# Patient Record
Sex: Male | Born: 1960
Health system: Southern US, Community
[De-identification: ages and names within clinical notes are randomized; demographics above are authoritative.]

## PROBLEM LIST (undated history)

## (undated) DIAGNOSIS — R002 Palpitations: Secondary | ICD-10-CM

## (undated) DIAGNOSIS — R079 Chest pain, unspecified: Secondary | ICD-10-CM

## (undated) DIAGNOSIS — I10 Essential (primary) hypertension: Secondary | ICD-10-CM

## (undated) DIAGNOSIS — R0989 Other specified symptoms and signs involving the circulatory and respiratory systems: Secondary | ICD-10-CM

## (undated) DIAGNOSIS — E782 Mixed hyperlipidemia: Secondary | ICD-10-CM

## (undated) DIAGNOSIS — I739 Peripheral vascular disease, unspecified: Secondary | ICD-10-CM

## (undated) DIAGNOSIS — R011 Cardiac murmur, unspecified: Secondary | ICD-10-CM

## (undated) DIAGNOSIS — G4733 Obstructive sleep apnea (adult) (pediatric): Secondary | ICD-10-CM

## (undated) HISTORY — DX: Peripheral vascular disease, unspecified: I73.9

## (undated) HISTORY — PX: BACK SURGERY: SHX140

## (undated) HISTORY — DX: Essential (primary) hypertension: I10

## (undated) HISTORY — DX: Chest pain, unspecified: R07.9

## (undated) HISTORY — PX: COLONOSCOPY: SHX174

## (undated) HISTORY — DX: Cardiac murmur, unspecified: R01.1

## (undated) HISTORY — DX: Obstructive sleep apnea (adult) (pediatric): G47.33

## (undated) HISTORY — DX: Mixed hyperlipidemia: E78.2

## (undated) HISTORY — PX: HEEL SPUR EXCISION: SHX1733

## (undated) HISTORY — PX: UPPER GASTROINTESTINAL ENDOSCOPY: SHX188

## (undated) HISTORY — DX: Other specified symptoms and signs involving the circulatory and respiratory systems: R09.89

---

## 2000-03-21 HISTORY — PX: OTHER SURGICAL HISTORY: SHX169

## 2002-01-25 ENCOUNTER — Ambulatory Visit (HOSPITAL_COMMUNITY): Admission: RE | Admit: 2002-01-25 | Discharge: 2002-01-25 | Payer: Self-pay | Admitting: Family Medicine

## 2002-01-25 ENCOUNTER — Encounter: Payer: Self-pay | Admitting: Family Medicine

## 2002-03-07 ENCOUNTER — Ambulatory Visit (HOSPITAL_COMMUNITY): Admission: RE | Admit: 2002-03-07 | Discharge: 2002-03-07 | Payer: Self-pay | Admitting: Family Medicine

## 2002-03-07 ENCOUNTER — Encounter: Payer: Self-pay | Admitting: Family Medicine

## 2004-01-21 ENCOUNTER — Ambulatory Visit (HOSPITAL_COMMUNITY): Admission: RE | Admit: 2004-01-21 | Discharge: 2004-01-21 | Payer: Self-pay | Admitting: Family Medicine

## 2004-05-13 ENCOUNTER — Ambulatory Visit (HOSPITAL_COMMUNITY): Admission: RE | Admit: 2004-05-13 | Discharge: 2004-05-13 | Payer: Self-pay | Admitting: Family Medicine

## 2004-05-14 ENCOUNTER — Ambulatory Visit: Payer: Self-pay | Admitting: *Deleted

## 2006-01-09 ENCOUNTER — Ambulatory Visit: Payer: Self-pay | Admitting: Orthopedic Surgery

## 2006-01-19 ENCOUNTER — Ambulatory Visit (HOSPITAL_COMMUNITY): Admission: RE | Admit: 2006-01-19 | Discharge: 2006-01-19 | Payer: Self-pay | Admitting: Orthopedic Surgery

## 2006-01-23 ENCOUNTER — Emergency Department (HOSPITAL_COMMUNITY): Admission: EM | Admit: 2006-01-23 | Discharge: 2006-01-24 | Payer: Self-pay | Admitting: *Deleted

## 2006-02-08 ENCOUNTER — Ambulatory Visit: Payer: Self-pay | Admitting: Orthopedic Surgery

## 2006-11-17 ENCOUNTER — Ambulatory Visit: Payer: Self-pay

## 2007-02-09 ENCOUNTER — Ambulatory Visit: Payer: Self-pay

## 2007-05-09 ENCOUNTER — Ambulatory Visit: Payer: Self-pay | Admitting: Gastroenterology

## 2008-10-31 DIAGNOSIS — R011 Cardiac murmur, unspecified: Secondary | ICD-10-CM

## 2008-10-31 HISTORY — DX: Cardiac murmur, unspecified: R01.1

## 2009-10-14 DIAGNOSIS — I739 Peripheral vascular disease, unspecified: Secondary | ICD-10-CM

## 2009-10-14 DIAGNOSIS — R079 Chest pain, unspecified: Secondary | ICD-10-CM

## 2009-10-14 DIAGNOSIS — R0989 Other specified symptoms and signs involving the circulatory and respiratory systems: Secondary | ICD-10-CM

## 2009-10-14 HISTORY — DX: Peripheral vascular disease, unspecified: I73.9

## 2009-10-14 HISTORY — DX: Other specified symptoms and signs involving the circulatory and respiratory systems: R09.89

## 2009-10-14 HISTORY — DX: Chest pain, unspecified: R07.9

## 2010-04-06 ENCOUNTER — Ambulatory Visit: Payer: Self-pay | Admitting: Unknown Physician Specialty

## 2010-04-21 ENCOUNTER — Encounter: Payer: Self-pay | Admitting: Surgery

## 2010-05-20 ENCOUNTER — Encounter: Payer: Self-pay | Admitting: Surgery

## 2012-01-13 ENCOUNTER — Other Ambulatory Visit: Payer: Self-pay | Admitting: Orthopedic Surgery

## 2012-01-13 DIAGNOSIS — M25561 Pain in right knee: Secondary | ICD-10-CM

## 2012-01-13 DIAGNOSIS — R531 Weakness: Secondary | ICD-10-CM

## 2012-01-13 DIAGNOSIS — R609 Edema, unspecified: Secondary | ICD-10-CM

## 2012-01-21 ENCOUNTER — Ambulatory Visit
Admission: RE | Admit: 2012-01-21 | Discharge: 2012-01-21 | Disposition: A | Discharge status: Home / Self Care | Payer: BC Managed Care – PPO | Source: Ambulatory Visit | Attending: Orthopedic Surgery | Admitting: Orthopedic Surgery

## 2012-01-21 DIAGNOSIS — R531 Weakness: Secondary | ICD-10-CM

## 2012-01-21 DIAGNOSIS — M25561 Pain in right knee: Secondary | ICD-10-CM

## 2012-01-21 DIAGNOSIS — R609 Edema, unspecified: Secondary | ICD-10-CM

## 2012-01-21 MED ORDER — GADOBENATE DIMEGLUMINE 529 MG/ML IV SOLN
20.0000 mL | Freq: Once | INTRAVENOUS | Status: AC | PRN
  Administered 2012-01-21: 20 mL via INTRAVENOUS

## 2012-04-06 ENCOUNTER — Encounter: Payer: Self-pay | Admitting: *Deleted

## 2012-07-25 ENCOUNTER — Encounter: Payer: Self-pay | Admitting: Internal Medicine

## 2012-08-03 ENCOUNTER — Ambulatory Visit (INDEPENDENT_AMBULATORY_CARE_PROVIDER_SITE_OTHER): Payer: BC Managed Care – PPO | Admitting: Internal Medicine

## 2012-08-03 ENCOUNTER — Encounter: Payer: Self-pay | Admitting: Internal Medicine

## 2012-08-03 VITALS — BP 152/96 | HR 53 | Ht 73.0 in | Wt 218.0 lb

## 2012-08-03 DIAGNOSIS — I10 Essential (primary) hypertension: Secondary | ICD-10-CM

## 2012-08-03 DIAGNOSIS — E785 Hyperlipidemia, unspecified: Secondary | ICD-10-CM

## 2012-08-03 DIAGNOSIS — R002 Palpitations: Secondary | ICD-10-CM

## 2012-08-03 DIAGNOSIS — E782 Mixed hyperlipidemia: Secondary | ICD-10-CM | POA: Insufficient documentation

## 2012-08-03 NOTE — Patient Instructions (Signed)
Your physician wants you to follow-up in 12 months 07/2013.  You will receive a reminder letter in the mail two months in advance. If you don't receive a letter, please call our office to schedule the follow-up appointment. Your physician recommends that you continue on your current medications as directed. Please refer to the Current Medication list given to you today.

## 2012-08-03 NOTE — Progress Notes (Signed)
[facility]          OFFICE NOTE   Chief Complaint:  Follow-up of Holter monitor  Primary Care Physician: No primary provider on file.  HPI:  Mr. Joshua Mann returns today for follow-up of a his Holter monitor. He is a 52 year old gentleman with a history of hypertension, dyslipidemia, obstructive sleep apnea on CPAP and palpitations. Recently he reports he has not been using his CPAP as often as he should be. He is also having some palpitations which are occurring more frequently. He has had these in the past and he has not really been bothered by them as much. He reports he does drink about 2-3 caffeinated beverages a day but occasionally drinks some uncaffeinated beverages as well. He has not been using his CPAP which certainly could be contributing to palpitations, but does not also report any significant anxiety. He says that the palpitations are happening more frequently and he is concerned enough about them to seek medical care.  He wore a monitor from 06/22/12-06/28/12 which essentially showed infrequent PAC's.  I did not see any high-risk arrythmias, although, he still reports being bothered by palpitations.  PMHx:  Past Medical History  Diagnosis Date  . Hypertension     controlled  . Mixed dyslipidemia   . Obstructive sleep apnea     on c-pap  . Murmur 10/31/2008    Echo EF >55%  AV apear mildly Sclerotic, No AS  . Chest pain 10/14/2009    Stress Test demontrated a small fixed Inferolateral perfusion defect  . Claudication 10/14/2009    Normal Lower Arterial Doppler  . Bruit 10/14/2009    Asymptomatic Bruit, Normal Carotid Doppler    Past Surgical History  Procedure Laterality Date  . Ulcers  2002    Stomach Bleeding    FAMHx:  Family History  Problem Relation Age of Onset  . Cancer Father 80  . Cancer Sister 42    SOCHx:   reports that he quit smoking about 30 years ago. He does not have any smokeless tobacco history on file. He  reports that he does not drink alcohol or use illicit drugs.  ALLERGIES:  Allergies  Allergen Reactions  . Lisinopril Cough    ROS: A comprehensive review of systems was negative except for: Cardiovascular: positive for palpitations  HOME MEDS: Current Outpatient Prescriptions  Medication Sig Dispense Refill  . aspirin EC 81 MG tablet Take 81 mg by mouth daily.      . verapamil (CALAN-SR) 240 MG CR tablet Take 240 mg by mouth at bedtime.      . fenofibrate (TRICOR) 145 MG tablet Take 145 mg by mouth daily.       No current facility-administered medications for this visit.    LABS/IMAGING: No results found for this or any previous visit (from the past 48 hour(s)). No results found.  VITALS: BP 152/96  Pulse 53  Ht 6\' 1"  (1.854 m)  Wt 218 lb (98.884 kg)  BMI 28.77 kg/m2  EXAM: deferred  EKG: Sinus bradycardia at 53, voltage criteria for LVH  ASSESSMENT: 1. Palpitations - PAC's 2. Hypertension - not at goal 3. Hyperlipidemia 4. OSA - non-compliant with CPAP  PLAN: 1.   Mr. Joshua Mann monitor showed essentially PAC's.  He still reports being bothered by palpitations. He is on high dose verapamil with a low baseline HR.  We discussed possibly adding a low dose b-blocker or changing his verapamil over to a b-blocker.  He was  not interested in changing unless the symptoms got worse. BP is noted to be a little high today. I again encouraged him to use the CPAP regularly and exercise more frequently.  We can see him back in the office annually or sooner as needed.  Chrystie Nose, MD, [facility] Attending Cardiologist [facility]  HILTY,Kenneth C 08/03/2012, 9:56 AM

## 2012-09-25 ENCOUNTER — Other Ambulatory Visit: Payer: Self-pay | Admitting: *Deleted

## 2012-09-25 MED ORDER — VERAPAMIL HCL ER 240 MG PO TBCR: 240.0000 mg | EXTENDED_RELEASE_TABLET | Freq: Every day | ORAL | Status: DC

## 2012-09-26 ENCOUNTER — Telehealth: Payer: Self-pay | Admitting: Internal Medicine

## 2012-09-26 NOTE — Telephone Encounter (Signed)
Returned call and spoke w/ Lyla Son.  Stated "they faxed back."  Stated pt had been on twice daily, but it was decreased.  No further action taken.

## 2012-09-26 NOTE — Telephone Encounter (Signed)
Paper chart requested.

## 2012-09-26 NOTE — Telephone Encounter (Signed)
Please call-question about prescription from yesterday!

## 2013-09-06 ENCOUNTER — Encounter: Payer: Self-pay | Admitting: Cardiovascular Disease

## 2013-09-06 ENCOUNTER — Encounter (INDEPENDENT_AMBULATORY_CARE_PROVIDER_SITE_OTHER): Payer: Self-pay

## 2013-09-06 ENCOUNTER — Ambulatory Visit (INDEPENDENT_AMBULATORY_CARE_PROVIDER_SITE_OTHER): Payer: BC Managed Care – PPO | Admitting: Cardiovascular Disease

## 2013-09-06 VITALS — BP 142/84 | HR 61 | Ht 73.0 in | Wt 209.5 lb

## 2013-09-06 DIAGNOSIS — R079 Chest pain, unspecified: Secondary | ICD-10-CM

## 2013-09-06 DIAGNOSIS — R002 Palpitations: Secondary | ICD-10-CM

## 2013-09-06 DIAGNOSIS — R0789 Other chest pain: Secondary | ICD-10-CM

## 2013-09-06 DIAGNOSIS — I10 Essential (primary) hypertension: Secondary | ICD-10-CM

## 2013-09-06 DIAGNOSIS — E785 Hyperlipidemia, unspecified: Secondary | ICD-10-CM

## 2013-09-06 MED ORDER — LOVASTATIN 20 MG PO TABS: 20.0000 mg | ORAL_TABLET | Freq: Every day | ORAL | Status: DC

## 2013-09-06 NOTE — Assessment & Plan Note (Signed)
Blood pressure is well controlled on today's visit. No changes made to the medications. 

## 2013-09-06 NOTE — Progress Notes (Signed)
   Patient ID: Carolin GuernseyMichael N Verstraete, male    DOB: 11/02/1960, 53 y.o.   MRN: [id number]  HPI Comments: Mr. Clinton SawyerWilliamson is a 53 year old gentleman with history of obstructive sleep apnea but does not wear CPAP, PACs on Holter monitor presents for followup in the [place] office. He was previously seeing in [place] office . Prior cholesterol more than 220   history of hypertension, dyslipidemia, obstructive sleep apnea on CPAP and palpitations. In general he is been doing well. He does report having occasional episodes of deep chest pain, more common at rest and with exertion. Symptoms did not last very long, can come on at various times. He reports one episode while getting ready to go to church over the weekend. Some episodes at work. Denies having any regular symptoms with exertion . No other associated symptoms such as diaphoresis, sweating or symptoms concerning for unstable angina.    He wore a monitor from 06/22/12-06/28/12 which essentially showed infrequent PAC's.  EKG showing normal sinus rhythm with rate 61 beats per minute, no significant ST or T wave changes   Outpatient Encounter Prescriptions as of 09/06/2013  Medication Sig  . aspirin EC 81 MG tablet Take 81 mg by mouth daily.  . verapamil (CALAN-SR) 240 MG CR tablet Take 1 tablet (240 mg total) by mouth at bedtime.     Review of Systems  Constitutional: Negative.   HENT: Negative.   Eyes: Negative.   Respiratory: Positive for chest tightness.   Cardiovascular: Negative.   Gastrointestinal: Negative.   Endocrine: Negative.   Musculoskeletal: Negative.   Skin: Negative.   Allergic/Immunologic: Negative.   Neurological: Negative.   Hematological: Negative.   Psychiatric/Behavioral: Negative.   All other systems reviewed and are negative.   BP 142/84  Pulse 61  Ht 6\' 1"  (1.854 m)  Wt 209 lb 8 oz (95.029 kg)  BMI 27.65 kg/m2  Physical Exam  Nursing note and vitals reviewed. Constitutional: He is oriented to person,  place, and time. He appears well-developed and well-nourished.  HENT:  Head: Normocephalic.  Nose: Nose normal.  Mouth/Throat: Oropharynx is clear and moist.  Eyes: Conjunctivae are normal. Pupils are equal, round, and reactive to light.  Neck: Normal range of motion. Neck supple. No JVD present.  Cardiovascular: Normal rate, regular rhythm, S1 normal, S2 normal, normal heart sounds and intact distal pulses.  Exam reveals no gallop and no friction rub.   No murmur heard. Pulmonary/Chest: Effort normal and breath sounds normal. No respiratory distress. He has no wheezes. He has no rales. He exhibits no tenderness.  Abdominal: Soft. Bowel sounds are normal. He exhibits no distension. There is no tenderness.  Musculoskeletal: Normal range of motion. He exhibits no edema and no tenderness.  Lymphadenopathy:    He has no cervical adenopathy.  Neurological: He is alert and oriented to person, place, and time. Coordination normal.  Skin: Skin is warm and dry. No rash noted. No erythema.  Psychiatric: He has a normal mood and affect. His behavior is normal. Judgment and thought content normal.      Assessment and Plan

## 2013-09-06 NOTE — Assessment & Plan Note (Signed)
Prior APCs on Holter monitor. Relatively asymptomatic

## 2013-09-06 NOTE — Assessment & Plan Note (Signed)
Atypical chest tightness, typically coming on at rest. No further workup at this time. We have recommended that he call our office if symptoms become more frequent, associated with exertion.

## 2013-09-06 NOTE — Patient Instructions (Signed)
You are doing well.  We will get your lab work from primary care If cholesterol is elevated, we would start lovastatin one a day  If you have worsening chest pain, call the office  Please call us if you have new issues that need to be addressed before your next appt.  Your physician wants you to follow-up in: 12 months.  You will receive a reminder letter in the mail two months in advance. If you don't receive a letter, please call our office to schedule the follow-up appointment.

## 2013-09-06 NOTE — Assessment & Plan Note (Signed)
We'll try to obtain his most recent lipid panel. Prior cholesterol more than 220. He would like to be aggressive with his cholesterol given his family history. We have sent in a prescription for lovastatin 20 mg daily. We have recommended he hold off on filling the prescription until we have had a chance to review his most recent lipid panel.

## 2014-03-15 ENCOUNTER — Other Ambulatory Visit: Payer: Self-pay | Admitting: Internal Medicine

## 2014-03-27 ENCOUNTER — Encounter (INDEPENDENT_AMBULATORY_CARE_PROVIDER_SITE_OTHER): Payer: Self-pay | Admitting: *Deleted

## 2014-03-27 NOTE — Progress Notes (Signed)
This encounter was created in error - please disregard.

## 2014-10-27 ENCOUNTER — Ambulatory Visit (INDEPENDENT_AMBULATORY_CARE_PROVIDER_SITE_OTHER): Payer: BLUE CROSS/BLUE SHIELD | Admitting: Cardiovascular Disease

## 2014-10-27 ENCOUNTER — Encounter: Payer: Self-pay | Admitting: Cardiovascular Disease

## 2014-10-27 VITALS — BP 162/92 | HR 54 | Ht 73.0 in | Wt 209.5 lb

## 2014-10-27 DIAGNOSIS — R002 Palpitations: Secondary | ICD-10-CM

## 2014-10-27 DIAGNOSIS — R0789 Other chest pain: Secondary | ICD-10-CM

## 2014-10-27 DIAGNOSIS — I10 Essential (primary) hypertension: Secondary | ICD-10-CM

## 2014-10-27 DIAGNOSIS — E785 Hyperlipidemia, unspecified: Secondary | ICD-10-CM

## 2014-10-27 MED ORDER — LOSARTAN POTASSIUM-HCTZ 100-12.5 MG PO TABS: 1.0000 | ORAL_TABLET | Freq: Every day | ORAL | Status: DC

## 2014-10-27 NOTE — Patient Instructions (Addendum)
You are doing well.  Blood pressure is high We will start losartan HCT one a day in the morning  Please call us if you have new issues that need to be addressed before your next appt.  Your physician wants you to follow-up in: 12 months.  You will receive a reminder letter in the mail two months in advance. If you don't receive a letter, please call our office to schedule the follow-up appointment.

## 2014-10-27 NOTE — Progress Notes (Signed)
Patient ID: Joshua Mann, male    DOB: [birth year], 54 y.o.   MRN: [id number]  HPI Comments: [name] is a 54 year old gentleman with history of obstructive sleep apnea on CPAP, PACs on Holter monitor presents for followup of his hypertension and high cholesterol Prior cholesterol more than 220  In follow-up today, he reports his blood pressure has been elevated at home, typically 160 up to 170 systolic. Reports he was previously taken off his blood pressure pill 2 years ago or more. Has not seen primary care yet this year. Lab work from last year showing total cholesterol 188 on lovastatin 20 mg daily, LDL 81 Otherwise he feels well with no complaints. Denies any tachycardia or palpitations  EKG on today's visit shows sinus bradycardia with rate 54 bpm, no significant ST or T-wave changes  Other past medical history  He wore a monitor from 06/22/12-06/28/12 which essentially showed infrequent PAC's.    Allergies  Allergen Reactions  . Lisinopril Cough    Current Outpatient Prescriptions on File Prior to Visit  Medication Sig Dispense Refill  . aspirin EC 81 MG tablet Take 81 mg by mouth daily.    Marland Kitchen lovastatin (MEVACOR) 20 MG tablet Take 1 tablet (20 mg total) by mouth at bedtime. 30 tablet 11  . verapamil (CALAN-SR) 240 MG CR tablet TAKE ONE TABLET BY MOUTH ONCE DAILY AT BEDTIME 60 tablet 3   No current facility-administered medications on file prior to visit.    Past Medical History  Diagnosis Date  . Hypertension     controlled  . Mixed dyslipidemia   . Obstructive sleep apnea     on c-pap  . Murmur 10/31/2008    Echo EF >55%  AV apear mildly Sclerotic, No AS  . Chest pain 10/14/2009    Stress Test demontrated a small fixed Inferolateral perfusion defect  . Claudication 10/14/2009    Normal Lower Arterial Doppler  . Bruit 10/14/2009    Asymptomatic Bruit, Normal Carotid Doppler    Past Surgical History  Procedure Laterality Date  . Ulcers  2002   Stomach Bleeding  . Heel spur excision      Social History  reports that he quit smoking about 32 years ago. His smoking use included Cigarettes. He has a 12 pack-year smoking history. He does not have any smokeless tobacco history on file. He reports that he does not drink alcohol or use illicit drugs.  Family History family history includes Cancer (age of onset: 55) in his sister; Cancer (age of onset: 55) in his father; Hypertension in his mother.   Review of Systems  Constitutional: Negative.   HENT: Negative.   Eyes: Negative.   Respiratory: Positive for chest tightness.   Cardiovascular: Negative.   Gastrointestinal: Negative.   Endocrine: Negative.   Musculoskeletal: Negative.   Skin: Negative.   Allergic/Immunologic: Negative.   Neurological: Negative.   Hematological: Negative.   Psychiatric/Behavioral: Negative.   All other systems reviewed and are negative.   BP 162/92 mmHg  Pulse 54  Ht  (1.854 m)  Wt 209 lb 8 oz (95.029 kg)  BMI 27.65 kg/m2  Physical Exam  Constitutional: He is oriented to person, place, and time. He appears well-developed and well-nourished.  HENT:  Head: Normocephalic.  Nose: Nose normal.  Mouth/Throat: Oropharynx is clear and moist.  Eyes: Conjunctivae are normal. Pupils are equal, round, and reactive to light.  Neck: Normal range of motion. Neck supple. No JVD present.  Cardiovascular: Normal rate, regular  rhythm, S1 normal, S2 normal, normal heart sounds and intact distal pulses.  Exam reveals no gallop and no friction rub.   No murmur heard. Pulmonary/Chest: Effort normal and breath sounds normal. No respiratory distress. He has no wheezes. He has no rales. He exhibits no tenderness.  Abdominal: Soft. Bowel sounds are normal. He exhibits no distension. There is no tenderness.  Musculoskeletal: Normal range of motion. He exhibits no edema or tenderness.  Lymphadenopathy:    He has no cervical adenopathy.  Neurological: He is  alert and oriented to person, place, and time. Coordination normal.  Skin: Skin is warm and dry. No rash noted. No erythema.  Psychiatric: He has a normal mood and affect. His behavior is normal. Judgment and thought content normal.      Assessment and Plan   Nursing note and vitals reviewed.

## 2014-10-27 NOTE — Assessment & Plan Note (Signed)
Blood pressures elevated. Even on recheck today and at home. Recommended he start losartan HCT 100/12.5 mg daily. Recommended recheck his basic metabolic panel in 1 month time. Also recommended he check his blood pressure at home and call our office if this continues to run high

## 2014-10-27 NOTE — Assessment & Plan Note (Signed)
Recommended he stay on the verapamil. Denies having any symptomatic palpitations

## 2014-10-27 NOTE — Assessment & Plan Note (Signed)
No further episodes of chest tightness per the patient. No further ischemia workup at this time

## 2014-10-27 NOTE — Assessment & Plan Note (Signed)
Recommended he stay on lovastatin 20 mg daily. LDL 81

## 2014-12-13 ENCOUNTER — Other Ambulatory Visit: Payer: Self-pay

## 2014-12-13 ENCOUNTER — Emergency Department
Admission: EM | Admit: 2014-12-13 | Discharge: 2014-12-14 | Disposition: A | Discharge status: Home / Self Care | Payer: BLUE CROSS/BLUE SHIELD | Attending: Emergency Medicine | Admitting: Emergency Medicine

## 2014-12-13 DIAGNOSIS — Z79899 Other long term (current) drug therapy: Secondary | ICD-10-CM | POA: Diagnosis not present

## 2014-12-13 DIAGNOSIS — F419 Anxiety disorder, unspecified: Secondary | ICD-10-CM | POA: Insufficient documentation

## 2014-12-13 DIAGNOSIS — Z87891 Personal history of nicotine dependence: Secondary | ICD-10-CM | POA: Diagnosis not present

## 2014-12-13 DIAGNOSIS — I10 Essential (primary) hypertension: Secondary | ICD-10-CM | POA: Diagnosis not present

## 2014-12-13 DIAGNOSIS — R252 Cramp and spasm: Secondary | ICD-10-CM | POA: Insufficient documentation

## 2014-12-13 DIAGNOSIS — Z7982 Long term (current) use of aspirin: Secondary | ICD-10-CM | POA: Diagnosis not present

## 2014-12-13 DIAGNOSIS — M791 Myalgia: Secondary | ICD-10-CM | POA: Diagnosis present

## 2014-12-13 LAB — COMPREHENSIVE METABOLIC PANEL
ALBUMIN: 4.6 g/dL (ref 3.5–5.0)
ALK PHOS: 56 U/L (ref 38–126)
ALT: 39 U/L (ref 17–63)
AST: 32 U/L (ref 15–41)
Anion gap: 11 (ref 5–15)
BUN: 20 mg/dL (ref 6–20)
CALCIUM: 9.8 mg/dL (ref 8.9–10.3)
CO2: 29 mmol/L (ref 22–32)
CREATININE: 1.44 mg/dL — AB (ref 0.61–1.24)
Chloride: 100 mmol/L — ABNORMAL LOW (ref 101–111)
GFR calc Af Amer: >60 mL/min (ref >60)
GFR calc non Af Amer: 53 mL/min — ABNORMAL LOW (ref >60)
GLUCOSE: 117 mg/dL — AB (ref 65–99)
Potassium: 4.6 mmol/L (ref 3.5–5.1)
SODIUM: 140 mmol/L (ref 135–145)
Total Bilirubin: 0.5 mg/dL (ref 0.3–1.2)
Total Protein: 7.3 g/dL (ref 6.5–8.1)

## 2014-12-13 LAB — URINALYSIS COMPLETE WITH MICROSCOPIC (ARMC ONLY)
Bacteria, UA: NONE SEEN
Bilirubin Urine: NEGATIVE
GLUCOSE, UA: NEGATIVE mg/dL
Hgb urine dipstick: NEGATIVE
Ketones, ur: NEGATIVE mg/dL
Leukocytes, UA: NEGATIVE
Nitrite: NEGATIVE
Protein, ur: NEGATIVE mg/dL
RBC / HPF: NONE SEEN RBC/hpf (ref 0–5)
SQUAMOUS EPITHELIAL / LPF: NONE SEEN
Specific Gravity, Urine: 1.015 (ref 1.005–1.030)
WBC, UA: NONE SEEN WBC/hpf (ref 0–5)
pH: 7 (ref 5.0–8.0)

## 2014-12-13 LAB — CBC
HCT: 45.1 % (ref 40.0–52.0)
Hemoglobin: 14.6 g/dL (ref 13.0–18.0)
MCH: 26.8 pg (ref 26.0–34.0)
MCHC: 32.4 g/dL (ref 32.0–36.0)
MCV: 82.6 fL (ref 80.0–100.0)
PLATELETS: 269 10*3/uL (ref 150–440)
RBC: 5.47 MIL/uL (ref 4.40–5.90)
RDW: 14.2 % (ref 11.5–14.5)
WBC: 13.4 10*3/uL — AB (ref 3.8–10.6)

## 2014-12-13 LAB — LIPASE, BLOOD: Lipase: 65 U/L — ABNORMAL HIGH (ref 22–51)

## 2014-12-13 LAB — TROPONIN I: Troponin I: <0.03 ng/mL (ref <0.031)

## 2014-12-13 LAB — GLUCOSE, CAPILLARY: Glucose-Capillary: 108 mg/dL — ABNORMAL HIGH (ref 65–99)

## 2014-12-13 LAB — CK: Total CK: 452 U/L — ABNORMAL HIGH (ref 49–397)

## 2014-12-13 MED ORDER — LORAZEPAM 2 MG/ML IJ SOLN
1.0000 mg | Freq: Once | INTRAMUSCULAR | Status: AC
  Administered 2014-12-13: 1 mg via INTRAMUSCULAR
  Filled 2014-12-13: qty 1

## 2014-12-13 MED ORDER — LORAZEPAM 0.5 MG PO TABS: 0.5000 mg | ORAL_TABLET | ORAL | Status: AC | PRN

## 2014-12-13 NOTE — Discharge Instructions (Signed)

## 2014-12-13 NOTE — ED Provider Notes (Signed)
[facility] Emergency Department Provider Note  ____________________________________________  Time seen: On arrival  I have reviewed the triage vital signs and the nursing notes.   HISTORY  Chief Complaint Muscle Pain and Dizziness    HPI Joshua Mann is a 54 y.o. male who complains of moderate to severe muscle cramping to legs arms and stomach. He reports this is happened before but not to this level. He is on a statin for cholesterol. He denies fevers chills. He denies recent exercise. No trauma. No nausea vomiting or diarrhea. No recent travel.     Past Medical History  Diagnosis Date  . Hypertension     controlled  . Mixed dyslipidemia   . Obstructive sleep apnea     on c-pap  . Murmur 10/31/2008    Echo EF >55%  AV apear mildly Sclerotic, No AS  . Chest pain 10/14/2009    Stress Test demontrated a small fixed Inferolateral perfusion defect  . Claudication 10/14/2009    Normal Lower Arterial Doppler  . Bruit 10/14/2009    Asymptomatic Bruit, Normal Carotid Doppler    Patient Active Problem List   Diagnosis Date Noted  . Chest tightness 09/06/2013  . Essential hypertension 08/03/2012  . Palpitations 08/03/2012  . Hyperlipidemia 08/03/2012    Past Surgical History  Procedure Laterality Date  . Ulcers  2002    Stomach Bleeding  . Heel spur excision      Current Outpatient Rx  Name  Route  Sig  Dispense  Refill  . aspirin EC 81 MG tablet   Oral   Take 81 mg by mouth daily.         Marland Kitchen losartan-hydrochlorothiazide (HYZAAR) 100-12.5 MG per tablet   Oral   Take 1 tablet by mouth daily.   90 tablet   3   . lovastatin (MEVACOR) 20 MG tablet   Oral   Take 1 tablet (20 mg total) by mouth at bedtime.   30 tablet   11   . verapamil (CALAN-SR) 240 MG CR tablet      TAKE ONE TABLET BY MOUTH ONCE DAILY AT BEDTIME   60 tablet   3     Allergies Lisinopril  Family History  Problem Relation Age of Onset  . Cancer  Father 73  . Cancer Sister 62  . Hypertension Mother     Social History Social History  Substance Use Topics  . Smoking status: Former Smoker -- 1.00 packs/day for 12 years    Types: Cigarettes    Quit date: 03/21/1982  . Smokeless tobacco: Not on file  . Alcohol Use: No    Review of Systems  Constitutional: Negative for fever. Eyes: Negative for visual changes. ENT: Negative for sore throat Cardiovascular: Negative for chest pain. Respiratory: Negative for shortness of breath. Gastrointestinal: Negative for abdominal pain, vomiting and diarrhea. Genitourinary: Negative for dysuria. Musculoskeletal: Negative for back pain. Positive for muscle cramping Skin: Negative for rash. Neurological: Negative for headaches or focal weakness Psychiatric positive for anxiety    ____________________________________________   PHYSICAL EXAM:  VITAL SIGNS: ED Triage Vitals  Enc Vitals Group     BP 12/13/14 1927 138/87 mmHg     Pulse Rate 12/13/14 1927 88     Resp 12/13/14 1927 18     Temp 12/13/14 1927 97.9 F (36.6 C)     Temp Source 12/13/14 1927 Oral     SpO2 12/13/14 1927 98 %     Weight 12/13/14 1927 204 lb (  92.534 kg)     Height 12/13/14 1927  (1.854 m)     Head Cir --      Peak Flow --      Pain Score 12/13/14 1927 5     Pain Loc --      Pain Edu? --      Excl. in GC? --      Constitutional: Alert and oriented. Well appearing and in no distress. Anxious Eyes: Conjunctivae are normal.  ENT   Head: Normocephalic and atraumatic.   Mouth/Throat: Mucous membranes are moist. Cardiovascular: Normal rate, regular rhythm. Normal and symmetric distal pulses are present in all extremities. No murmurs, rubs, or gallops. Respiratory: Normal respiratory effort without tachypnea nor retractions. Breath sounds are clear and equal bilaterally.  Gastrointestinal: Soft and non-tender in all quadrants. No distention. There is no CVA tenderness. Genitourinary:  deferred Musculoskeletal: Nontender with normal range of motion in all extremities. No lower extremity tenderness nor edema. Neurologic:  Normal speech and language. No gross focal neurologic deficits are appreciated. Skin:  Skin is warm, dry and intact. No rash noted. Psychiatric: Mood and affect are normal. Patient exhibits appropriate insight and judgment.  ____________________________________________    LABS (pertinent positives/negatives)  Labs Reviewed  CBC - Abnormal; Notable for the following:    WBC 13.4 (*)    All other components within normal limits  URINALYSIS COMPLETEWITH MICROSCOPIC ([facility] ONLY) - Abnormal; Notable for the following:    Color, Urine STRAW (*)    APPearance CLEAR (*)    All other components within normal limits  COMPREHENSIVE METABOLIC PANEL - Abnormal; Notable for the following:    Chloride 100 (*)    Glucose, Bld 117 (*)    Creatinine, Ser 1.44 (*)    GFR calc non Af Amer 53 (*)    All other components within normal limits  CK - Abnormal; Notable for the following:    Total CK 452 (*)    All other components within normal limits  GLUCOSE, CAPILLARY - Abnormal; Notable for the following:    Glucose-Capillary 108 (*)    All other components within normal limits  TROPONIN I  LIPASE, BLOOD  CBG MONITORING, ED    ____________________________________________   EKG  ED ECG REPORT I, Jene Every, the attending physician, personally viewed and interpreted this ECG.  Date: 12/13/2014 EKG Time: 7:37 PM Rate: 76 Rhythm: normal sinus rhythm QRS Axis: normal Intervals: normal ST/T Wave abnormalities: normal Conduction Disutrbances: none Narrative Interpretation: unremarkable   ____________________________________________    RADIOLOGY I have personally reviewed any xrays that were ordered on this patient: Nonee  ____________________________________________   PROCEDURES  Procedure(s) performed: none  Critical Care performed:  none  ____________________________________________   INITIAL IMPRESSION / ASSESSMENT AND PLAN / ED COURSE  Pertinent labs & imaging results that were available during my care of the patient were reviewed by me and considered in my medical decision making (see chart for details).  Patient overall well-appearing. No abdominal tenderness to palpation. He complains of spasms in his legs and in the oblique muscles of the right abdomen. No fevers no chills. Lab work is essentially unremarkable. CK of 432 is nonspecific. Patient given Ativan 1 mg IM with improvement of symptoms   I counseled DC statin and follow-up with PCP. Return precautions discussed  ____________________________________________   FINAL CLINICAL IMPRESSION(S) / ED DIAGNOSES  Final diagnoses:  Muscle cramping     Jene Every, MD 12/13/14 2342

## 2014-12-13 NOTE — ED Notes (Addendum)
Pt says last evening he began having cramping to muscles in legs, arms, stomach and both sides of his neck; denies chest pain; denies shortness of breath; denies fever; denies N/V/D; ambulatory with slow steady gait; pt then adds about 2 hours pta arrival he began to feel "woozy";

## 2014-12-13 NOTE — ED Notes (Signed)
Discussed pt's presenting symptoms with Dr Cyril Loosen; labs added and changed by him at this time;

## 2014-12-15 ENCOUNTER — Telehealth: Payer: Self-pay

## 2014-12-15 NOTE — Telephone Encounter (Signed)
Pt called, states he has been taking Losartan for the last month, and ever since then she has had back cramps and nauseating. States he went to the ED on Saturday, and they told him it was comig from his medication. Please call.

## 2014-12-15 NOTE — Telephone Encounter (Addendum)
Spoke w/ pt.  He reports that his lovastatin was d/c'd at the ED.   He reports that he believes his sx of nausea & myalgias were caused by losartan instead.  Pt has not taken lovastatin or losartan since Saturday and feels better.  Pt requests restarting diltiazem, as he previously took this w/ no adverse side effects.

## 2014-12-15 NOTE — Telephone Encounter (Signed)
Left message for pt to call back  °

## 2014-12-15 NOTE — Telephone Encounter (Signed)
On his last clinic visit he was on verapamil We suggested he stay on this In addition we added losartan HCTZ Okay for him to stop the latter if he has symptoms but presumably he is still on verapamil?  Note from the patient suggest he would like to start diltiazem. Do not need diltiazem if he is on verapamil

## 2014-12-16 NOTE — Telephone Encounter (Signed)
Spoke w/ pt.  Advised him of Dr. Windell Hummingbird recommendation.  He reports that he is taking verapamil. He will continue to monitor his BP and call the office if it remains elevated.

## 2015-03-11 NOTE — Progress Notes (Unsigned)
Pt presented to office today w/ his wife for her new pt appt. While here, he asks that we check his BP, as he recently stopped his meds due to muscle cramps. Pt's manual BP 190/105, on machine 184/103. Discussed w/ Dr. Mariah MillingGollan who advises that pt resume BP meds. Advised pt to resume Hyzaar 100-12.5 mg once daily. He is hesitant to do so, as he reports that ED was also a side effect from this med that he does not want to resume taking.  Discussed w/ him the importance of getting his BP down and for him to take a pill as soon as he gets home, try to eat something w/ K+ and to call me this afternoon w/ readings.  Had pt stop at the front desk to schedule appt to see Dr. Mariah MillingGollan or Eula Listenyan Dunn, PA, as he would like to discuss med change.

## 2015-05-13 ENCOUNTER — Ambulatory Visit (INDEPENDENT_AMBULATORY_CARE_PROVIDER_SITE_OTHER): Payer: BLUE CROSS/BLUE SHIELD | Admitting: Cardiovascular Disease

## 2015-05-13 ENCOUNTER — Encounter: Payer: Self-pay | Admitting: Cardiovascular Disease

## 2015-05-13 VITALS — BP 140/88 | HR 52 | Ht 73.0 in | Wt 219.8 lb

## 2015-05-13 DIAGNOSIS — I10 Essential (primary) hypertension: Secondary | ICD-10-CM

## 2015-05-13 DIAGNOSIS — M79602 Pain in left arm: Secondary | ICD-10-CM

## 2015-05-13 DIAGNOSIS — R079 Chest pain, unspecified: Secondary | ICD-10-CM | POA: Diagnosis not present

## 2015-05-13 DIAGNOSIS — R001 Bradycardia, unspecified: Secondary | ICD-10-CM | POA: Diagnosis not present

## 2015-05-13 DIAGNOSIS — E785 Hyperlipidemia, unspecified: Secondary | ICD-10-CM | POA: Diagnosis not present

## 2015-05-13 DIAGNOSIS — R0789 Other chest pain: Secondary | ICD-10-CM

## 2015-05-13 DIAGNOSIS — R002 Palpitations: Secondary | ICD-10-CM

## 2015-05-13 MED ORDER — DOXAZOSIN MESYLATE 8 MG PO TABS: 8.0000 mg | ORAL_TABLET | Freq: Every day | ORAL | Status: DC

## 2015-05-13 MED ORDER — LOVASTATIN 20 MG PO TABS: 20.0000 mg | ORAL_TABLET | Freq: Every day | ORAL | Status: DC

## 2015-05-13 NOTE — Assessment & Plan Note (Signed)
Heart rate very low , possibly in the 40s at rest at home , possibly explaining some of his fatigue  We will decrease verapamil down to  120 milligram daily as he has been asymptomatic

## 2015-05-13 NOTE — Progress Notes (Signed)
Patient ID: Joshua Mann, male    DOB: 12-06-1960, 55 y.o.   MRN: [id number]  HPI Comments: Joshua Mann is a 55 year old gentleman with history of obstructive sleep apnea on CPAP, PACs on Holter monitor presents for followup of his hypertension and high cholesterol Prior cholesterol more than 220   in follow-up today, he reports that he is tired, also having some sexual dysfunction  Able to work without difficulty  Denies any chest pain, shortness of breath, leg edema  No recent palpitations   Review of EKG from today shows sinus bradycardia with rate 52 bpm  Lab work from last year showing total cholesterol 188 on lovastatin 20 mg daily, LDL 81  Other past medical history  He wore a monitor from 06/22/12-06/28/12 which essentially showed infrequent PAC's.    Allergies  Allergen Reactions  . Lisinopril Cough    Current Outpatient Prescriptions on File Prior to Visit  Medication Sig Dispense Refill  . aspirin EC 81 MG tablet Take 81 mg by mouth daily.    Marland Kitchen LORazepam (ATIVAN) 0.5 MG tablet Take 1 tablet (0.5 mg total) by mouth as needed (craming). 20 tablet 0  . losartan-hydrochlorothiazide (HYZAAR) 100-12.5 MG per tablet Take 1 tablet by mouth daily. 90 tablet 3  . verapamil (CALAN-SR) 240 MG CR tablet TAKE ONE TABLET BY MOUTH ONCE DAILY AT BEDTIME 60 tablet 3   No current facility-administered medications on file prior to visit.    Past Medical History  Diagnosis Date  . Hypertension     controlled  . Mixed dyslipidemia   . Obstructive sleep apnea     on c-pap  . Murmur 10/31/2008    Echo EF >55%  AV apear mildly Sclerotic, No AS  . Chest pain 10/14/2009    Stress Test demontrated a small fixed Inferolateral perfusion defect  . Claudication (HCC) 10/14/2009    Normal Lower Arterial Doppler  . Bruit 10/14/2009    Asymptomatic Bruit, Normal Carotid Doppler    Past Surgical History  Procedure Laterality Date  . Ulcers  2002    Stomach Bleeding  . Heel spur  excision      Social History  reports that he quit smoking about 33 years ago. His smoking use included Cigarettes. He has a 12 pack-year smoking history. He does not have any smokeless tobacco history on file. He reports that he does not drink alcohol or use illicit drugs.  Family History family history includes Cancer (age of onset: 71) in his sister; Cancer (age of onset: 79) in his father; Hypertension in his mother.   Review of Systems  Constitutional: Negative.   Respiratory: Negative.   Cardiovascular: Negative.   Gastrointestinal: Negative.   Musculoskeletal: Negative.   Neurological: Negative.   Hematological: Negative.   Psychiatric/Behavioral: Negative.   All other systems reviewed and are negative.   BP 140/88 mmHg  Pulse 52  Ht  (1.854 m)  Wt 219 lb 12 oz (99.678 kg)  BMI 29.00 kg/m2  Physical Exam  Constitutional: He is oriented to person, place, and time. He appears well-developed and well-nourished.  HENT:  Head: Normocephalic.  Nose: Nose normal.  Mouth/Throat: Oropharynx is clear and moist.  Eyes: Conjunctivae are normal. Pupils are equal, round, and reactive to light.  Neck: Normal range of motion. Neck supple. No JVD present.  Cardiovascular: Regular rhythm, S1 normal, S2 normal, normal heart sounds and intact distal pulses.  Bradycardia present.  Exam reveals no gallop and no friction rub.  No murmur heard. Pulmonary/Chest: Effort normal and breath sounds normal. No respiratory distress. He has no wheezes. He has no rales. He exhibits no tenderness.  Abdominal: Soft. Bowel sounds are normal. He exhibits no distension. There is no tenderness.  Musculoskeletal: Normal range of motion. He exhibits no edema or tenderness.  Lymphadenopathy:    He has no cervical adenopathy.  Neurological: He is alert and oriented to person, place, and time. Coordination normal.  Skin: Skin is warm and dry. No rash noted. No erythema.  Psychiatric: He has a normal  mood and affect. His behavior is normal. Judgment and thought content normal.      Assessment and Plan   Nursing note and vitals reviewed.

## 2015-05-13 NOTE — Assessment & Plan Note (Signed)
Verapamil decreased down to  120 mg daily,  We'll start Cardura 4 mg titrating up to 8 mg in the evening for blood pressure control   Recommended that he call our office with blood pressure measurements in the next week or 2

## 2015-05-13 NOTE — Patient Instructions (Addendum)
You are doing well.  Please cut the verapamil in 1/2 (heart raty is low) Start doxazosin/cardura 1/2 pill at night  If blood pressure runs high, Increase cardura up to a full pill  Monitor your blood pressure, Call if it continues to be elevated   Please call us if you have new issues that need to be addressed before your next appt.  Your physician wants you to follow-up in: 6 months.  You will receive a reminder letter in the mail two months in advance. If you don't receive a letter, please call our office to schedule the follow-up appointment.

## 2015-05-13 NOTE — Assessment & Plan Note (Signed)
Encouraged him to stay on his lovastatin 

## 2015-05-13 NOTE — Assessment & Plan Note (Signed)
Atypical left arm pain, no significant chest pain  No further workup at this time   Total encounter time more than 25 minutes  Greater than 50% was spent in counseling and coordination of care with the patient

## 2015-05-13 NOTE — Assessment & Plan Note (Signed)
Bradycardia likely secondary to verapamil  We have recommended he decrease the dose down to 120 mg daily and monitor his heart rate and blood pressure at home

## 2015-11-05 ENCOUNTER — Other Ambulatory Visit: Payer: Self-pay | Admitting: Cardiovascular Disease

## 2015-11-05 DIAGNOSIS — R002 Palpitations: Secondary | ICD-10-CM

## 2015-11-05 DIAGNOSIS — I10 Essential (primary) hypertension: Secondary | ICD-10-CM

## 2015-11-13 ENCOUNTER — Encounter (INDEPENDENT_AMBULATORY_CARE_PROVIDER_SITE_OTHER): Payer: Self-pay

## 2015-11-13 ENCOUNTER — Ambulatory Visit (INDEPENDENT_AMBULATORY_CARE_PROVIDER_SITE_OTHER): Payer: 59 | Admitting: Cardiovascular Disease

## 2015-11-13 ENCOUNTER — Encounter: Payer: Self-pay | Admitting: Cardiovascular Disease

## 2015-11-13 VITALS — BP 131/78 | HR 52 | Ht 73.0 in | Wt 212.5 lb

## 2015-11-13 DIAGNOSIS — E785 Hyperlipidemia, unspecified: Secondary | ICD-10-CM

## 2015-11-13 DIAGNOSIS — R002 Palpitations: Secondary | ICD-10-CM

## 2015-11-13 DIAGNOSIS — I10 Essential (primary) hypertension: Secondary | ICD-10-CM

## 2015-11-13 DIAGNOSIS — R0789 Other chest pain: Secondary | ICD-10-CM | POA: Diagnosis not present

## 2015-11-13 DIAGNOSIS — R001 Bradycardia, unspecified: Secondary | ICD-10-CM

## 2015-11-13 MED ORDER — DOXAZOSIN MESYLATE 8 MG PO TABS: 8.0000 mg | ORAL_TABLET | Freq: Every day | ORAL | 3 refills | Status: DC

## 2015-11-13 MED ORDER — LOSARTAN POTASSIUM-HCTZ 100-12.5 MG PO TABS: 1.0000 | ORAL_TABLET | Freq: Every day | ORAL | 3 refills | Status: DC

## 2015-11-13 MED ORDER — LOVASTATIN 20 MG PO TABS: 20.0000 mg | ORAL_TABLET | Freq: Every day | ORAL | 3 refills | Status: DC

## 2015-11-13 NOTE — Progress Notes (Signed)
Cardiology Office Note  Date:  11/13/2015   ID:  Carolin GuernseyMichael N Jenkinson, DOB 10/02/1960, MRN [id number]  PCP:  Alice ReichertMCINNIS,ANGUS G, MD   Chief Complaint  Patient presents with  . Other    6 month f/u c/o dizziness and bilateral leg numbness when sitting. Review PAD. Meds reviewed verbally with pt.    HPI:  Mr. Clinton SawyerWilliamson is a 55 year old gentleman with history of obstructive sleep apnea on CPAP, PACs on Holter monitor presents for followup of his hypertension and high cholesterol. Prior cholesterol more than 220  In follow-up today he presents with his wife and daughter Denies having any symptoms, rare dizziness from sitting or standing position Orthostatics done in the office showing no change in blood pressure with standing Asymptomatic bradycardia, rate in the 50s No recent lab work available, reports he does not have primary care   in follow-up today, he reports that he is tired, also having some sexual dysfunction  Able to work without difficulty  Denies any chest pain, shortness of breath, leg edema  No recent palpitations     Review of EKG from today shows sinus bradycardia with rate 52 bpm  Previous lab work total cholesterol 188 on lovastatin 20 mg daily, LDL 81  Other past medical history  He wore a monitor from 06/22/12-06/28/12 which essentially showed infrequent PAC's.    PMH:   has a past medical history of Bruit (10/14/2009); Chest pain (10/14/2009); Claudication (HCC) (10/14/2009); Hypertension; Mixed dyslipidemia; Murmur (10/31/2008); and Obstructive sleep apnea.  PSH:    Past Surgical History:  Procedure Laterality Date  . HEEL SPUR EXCISION    . Ulcers  2002   Stomach Bleeding    Current Outpatient Prescriptions  Medication Sig Dispense Refill  . aspirin EC 81 MG tablet Take 81 mg by mouth daily.    . cyclobenzaprine (FLEXERIL) 10 MG tablet Take 10 mg by mouth 3 (three) times daily as needed for muscle spasms.    Marland Kitchen. doxazosin (CARDURA) 8 MG tablet Take  1 tablet (8 mg total) by mouth daily. 90 tablet 3  . LORazepam (ATIVAN) 0.5 MG tablet Take 1 tablet (0.5 mg total) by mouth as needed (craming). 20 tablet 0  . losartan-hydrochlorothiazide (HYZAAR) 100-12.5 MG tablet Take 1 tablet by mouth daily. 90 tablet 3  . lovastatin (MEVACOR) 20 MG tablet Take 1 tablet (20 mg total) by mouth at bedtime. 90 tablet 3   No current facility-administered medications for this visit.      Allergies:   Lisinopril   Social History:  The patient  reports that he quit smoking about 33 years ago. His smoking use included Cigarettes. He has a 12.00 pack-year smoking history. He has never used smokeless tobacco. He reports that he does not drink alcohol or use drugs.   Family History:   family history includes Cancer (age of onset: 4162) in his sister; Cancer (age of onset: 2174) in his father; Hypertension in his mother.    Review of Systems: Review of Systems  Constitutional: Negative.   Respiratory: Negative.   Cardiovascular: Negative.   Gastrointestinal: Negative.   Musculoskeletal: Negative.   Neurological: Negative.   Psychiatric/Behavioral: Negative.   All other systems reviewed and are negative.    PHYSICAL EXAM: VS:  BP 131/78 (BP Location: Left Arm, Patient Position: Sitting, Cuff Size: Large)   Pulse (!) 52   Ht 6\' 1"  (1.854 m)   Wt 212 lb 8 oz (96.4 kg)   BMI 28.04 kg/m  , BMI Body mass  index is 28.04 kg/m. GEN: Well nourished, well developed, in no acute distress  HEENT: normal  Neck: no JVD, carotid bruits, or masses Cardiac: RRR; no murmurs, rubs, or gallops,no edema  Respiratory:  clear to auscultation bilaterally, normal work of breathing GI: soft, nontender, nondistended, + BS MS: no deformity or atrophy  Skin: warm and dry, no rash Neuro:  Strength and sensation are intact Psych: euthymic mood, full affect    Recent Labs: 12/13/2014: ALT 39; BUN 20; Creatinine, Ser 1.44; Hemoglobin 14.6; Platelets 269; Potassium 4.6; Sodium  140    Lipid Panel No results found for: CHOL, HDL, LDLCALC, TRIG    Wt Readings from Last 3 Encounters:  11/13/15 212 lb 8 oz (96.4 kg)  05/13/15 219 lb 12 oz (99.7 kg)  12/13/14 204 lb (92.5 kg)       ASSESSMENT AND PLAN:  Essential hypertension - Blood pressure is well controlled on today's visit. No changes made to the medications.  Palpitations -  Denies having any significant palpitations, Currently not taking verapamil. No changes to his medications  Hyperlipidemia -  Recommended he stay on his lovastatin. Lab work ordered for today as he does not have primary care. Will draw in the office  Chest tightness  denies having symptoms of chest pain  No further workup at this time   Bradycardia  even without verapamil, continues to run slow heart rate Long discussion with him, he is asymptomatic   Total encounter time more than 25 minutes  Greater than 50% was spent in counseling and coordination of care with the patient  Disposition:   F/U  12 months   Orders Placed This Encounter  Procedures  . Hepatic function panel  . Lipid Profile  . CBC with Differential/Platelet  . Basic Metabolic Panel (BMET)  . EKG 12-Lead     Signed, Dossie Arbour, M.D., Ph.D. 11/13/2015  [facility] [place], [place] [phone]

## 2015-11-13 NOTE — Patient Instructions (Addendum)
Medication Instructions:   No medication changes  Labwork:  We will check liver, lipids, CBC, BMP  Testing/Procedures:  No testing needed   Follow-Up: It was a pleasure seeing you in the office today. Please call us if you have new issues that need to be addressed before your next appt.  [phone]  Your physician wants you to follow-up in: 12 months.  You will receive a reminder letter in the mail two months in advance. If you don't receive a letter, please call our office to schedule the follow-up appointment.  If you need a refill on your cardiac medications before your next appointment, please call your pharmacy.

## 2015-11-14 LAB — CBC WITH DIFFERENTIAL/PLATELET
BASOS ABS: 0.1 10*3/uL (ref 0.0–0.2)
Basos: 2 %
EOS (ABSOLUTE): 0.1 10*3/uL (ref 0.0–0.4)
EOS: 1 %
HEMATOCRIT: 40.3 % (ref 37.5–51.0)
HEMOGLOBIN: 13.2 g/dL (ref 12.6–17.7)
IMMATURE GRANS (ABS): 0 10*3/uL (ref 0.0–0.1)
Immature Granulocytes: 1 %
LYMPHS ABS: 1.2 10*3/uL (ref 0.7–3.1)
LYMPHS: 26 %
MCH: 26.1 pg — ABNORMAL LOW (ref 26.6–33.0)
MCHC: 32.8 g/dL (ref 31.5–35.7)
MCV: 80 fL (ref 79–97)
MONOCYTES: 11 %
Monocytes Absolute: 0.5 10*3/uL (ref 0.1–0.9)
NEUTROS ABS: 2.9 10*3/uL (ref 1.4–7.0)
Neutrophils: 59 %
Platelets: 199 10*3/uL (ref 150–379)
RBC: 5.05 x10E6/uL (ref 4.14–5.80)
RDW: 13.6 % (ref 12.3–15.4)
WBC: 4.8 10*3/uL (ref 3.4–10.8)

## 2015-11-14 LAB — BASIC METABOLIC PANEL
BUN/Creatinine Ratio: 11 (ref 9–20)
BUN: 13 mg/dL (ref 6–24)
CALCIUM: 9.1 mg/dL (ref 8.7–10.2)
CO2: 24 mmol/L (ref 18–29)
Chloride: 100 mmol/L (ref 96–106)
Creatinine, Ser: 1.19 mg/dL (ref 0.76–1.27)
GFR, EST AFRICAN AMERICAN: 78 mL/min/{1.73_m2} (ref >59)
GFR, EST NON AFRICAN AMERICAN: 68 mL/min/{1.73_m2} (ref >59)
Glucose: 104 mg/dL — ABNORMAL HIGH (ref 65–99)
POTASSIUM: 3.9 mmol/L (ref 3.5–5.2)
Sodium: 144 mmol/L (ref 134–144)

## 2015-11-14 LAB — LIPID PANEL
CHOL/HDL RATIO: 5.2 ratio — AB (ref 0.0–5.0)
CHOLESTEROL TOTAL: 170 mg/dL (ref 100–199)
HDL: 33 mg/dL — AB (ref >39)
LDL Calculated: 80 mg/dL (ref 0–99)
TRIGLYCERIDES: 285 mg/dL — AB (ref 0–149)
VLDL Cholesterol Cal: 57 mg/dL — ABNORMAL HIGH (ref 5–40)

## 2015-11-14 LAB — HEPATIC FUNCTION PANEL
ALK PHOS: 52 IU/L (ref 39–117)
ALT: 22 IU/L (ref 0–44)
AST: 22 IU/L (ref 0–40)
Albumin: 4.4 g/dL (ref 3.5–5.5)
Bilirubin Total: 0.6 mg/dL (ref 0.0–1.2)
Bilirubin, Direct: 0.13 mg/dL (ref 0.00–0.40)
Total Protein: 6.5 g/dL (ref 6.0–8.5)

## 2016-11-26 ENCOUNTER — Other Ambulatory Visit: Payer: Self-pay | Admitting: Cardiovascular Disease

## 2016-11-26 DIAGNOSIS — I10 Essential (primary) hypertension: Secondary | ICD-10-CM

## 2016-11-26 DIAGNOSIS — R002 Palpitations: Secondary | ICD-10-CM

## 2017-01-11 ENCOUNTER — Other Ambulatory Visit: Payer: Self-pay | Admitting: Cardiovascular Disease

## 2017-01-11 DIAGNOSIS — R002 Palpitations: Secondary | ICD-10-CM

## 2017-01-11 DIAGNOSIS — I10 Essential (primary) hypertension: Secondary | ICD-10-CM

## 2017-02-19 DIAGNOSIS — G4733 Obstructive sleep apnea (adult) (pediatric): Secondary | ICD-10-CM | POA: Insufficient documentation

## 2017-02-19 DIAGNOSIS — Z9989 Dependence on other enabling machines and devices: Secondary | ICD-10-CM

## 2017-02-19 NOTE — Progress Notes (Signed)
Cardiology Office Note  Date:  02/20/2017   ID:  Joshua Mann, DOB 03/14/1961, MRN [id number]  [facility]   Chief Complaint  Patient presents with  . other    12 month follow up. Meds reviewed by the pt. verbally. "doing well."     HPI:  Mr. Joshua Mann is a 56 year old gentleman with history of  obstructive sleep apnea on CPAP,  PACs on Holter monitor Prior cholesterol more than 220  presents for followup of his hypertension and high cholesterol, bradycardia.  Presents with his wife Main issue on today's visit is chronic cramping This is a long-standing issue, etiology unclear Reports he previously held his lovastatin with no significant improvement Cramping seems to happen later in the day, evening, overnight Reports he is hydrated  Otherwise reports feels well, no shortness of breath or chest pain on exertion Previously with some sexual dysfunction Denies any palpitations  EKG personally reviewed by myself on todays visit Shows normal sinus rhythm rate 64 bpm no significant ST or T wave changes  Previous lab work total cholesterol 188 on lovastatin 20 mg daily, LDL 81  Other past medical history  He wore a monitor from 06/22/12-06/28/12 which essentially showed infrequent PAC's.    PMH:   has a past medical history of Bruit (10/14/2009), Chest pain (10/14/2009), Claudication (HCC) (10/14/2009), Hypertension, Mixed dyslipidemia, Murmur (10/31/2008), and Obstructive sleep apnea.  PSH:    Past Surgical History:  Procedure Laterality Date  . HEEL SPUR EXCISION    . Ulcers  2002   Stomach Bleeding    Current Outpatient Medications  Medication Sig Dispense Refill  . aspirin EC 81 MG tablet Take 81 mg by mouth daily.    . cyclobenzaprine (FLEXERIL) 10 MG tablet Take 10 mg by mouth 3 (three) times daily as needed for muscle spasms.    Marland Kitchen. doxazosin (CARDURA) 8 MG tablet TAKE ONE TABLET BY MOUTH ONCE DAILY 30 tablet 0  .  lovastatin (MEVACOR) 20 MG tablet Take 1 tablet (20 mg total) by mouth at bedtime. 90 tablet 3  . losartan (COZAAR) 100 MG tablet Take 1 tablet (100 mg total) by mouth daily. 90 tablet 3   No current facility-administered medications for this visit.      Allergies:   Lisinopril   Social History:  The patient  reports that he quit smoking about 34 years ago. His smoking use included cigarettes. He has a 12.00 pack-year smoking history. he has never used smokeless tobacco. He reports that he does not drink alcohol or use drugs.   Family History:   family history includes Cancer (age of onset: 6362) in his sister; Cancer (age of onset: 6974) in his father; Hypertension in his mother.    Review of Systems: Review of Systems  Constitutional: Negative.   Respiratory: Negative.   Cardiovascular: Negative.   Gastrointestinal: Negative.   Musculoskeletal: Negative.        Muscle cramps  Neurological: Negative.   Psychiatric/Behavioral: Negative.   All other systems reviewed and are negative.    PHYSICAL EXAM: VS:  BP 120/80 (BP Location: Left Arm, Patient Position: Sitting, Cuff Size: Normal)   Pulse 64   Ht 6\' 1"  (1.854 m)   Wt 212 lb (96.2 kg)   BMI 27.97 kg/m  , BMI Body mass index is 27.97 kg/m. GEN: Well nourished, well developed, in no acute distress  HEENT: normal  Neck: no JVD, carotid bruits, or masses Cardiac: RRR; no murmurs, rubs,  or gallops,no edema  Respiratory:  clear to auscultation bilaterally, normal work of breathing GI: soft, nontender, nondistended, + BS MS: no deformity or atrophy  Skin: warm and dry, no rash Neuro:  Strength and sensation are intact Psych: euthymic mood, full affect    Recent Labs: No results found for requested labs within last 8760 hours.    Lipid Panel Lab Results  Component Value Date   CHOL 170 11/13/2015   HDL 33 (L) 11/13/2015   LDLCALC 80 11/13/2015   TRIG 285 (H) 11/13/2015      Wt Readings from Last 3 Encounters:   02/20/17 212 lb (96.2 kg)  11/13/15 212 lb 8 oz (96.4 kg)  05/13/15 219 lb 12 oz (99.7 kg)       ASSESSMENT AND PLAN:  Essential hypertension - Given his severe muscle cramps we have recommended he stop the losartan HCTZ and take losartan 100 mg daily without HCTZ Blood pressure well controlled  Palpitations -  Denies having any significant palpitations, No further testing.  Previously on verapamil, this was held previously  Hyperlipidemia -  Recommended he hold HCTZ and lovastatin for several weeks given his continued muscle cramping If no improvement other workup may be needed  Chest tightness  denies having symptoms of chest pain  No further workup at this time  Stable   Bradycardia Heart rate adequate level, asymptomatic No further workup   Total encounter time more than 25 minutes  Greater than 50% was spent in counseling and coordination of care with the patient  Disposition:   F/U  12 months   Orders Placed This Encounter  Procedures  . EKG 12-Lead     Signed, Dossie Arbourim Rolen Conger, M.D., Ph.D. 02/20/2017  [facility], [place] [phone]

## 2017-02-20 ENCOUNTER — Ambulatory Visit: Payer: 59 | Admitting: Cardiovascular Disease

## 2017-02-20 ENCOUNTER — Encounter: Payer: Self-pay | Admitting: Cardiovascular Disease

## 2017-02-20 VITALS — BP 120/80 | HR 64 | Ht 73.0 in | Wt 212.0 lb

## 2017-02-20 DIAGNOSIS — R0789 Other chest pain: Secondary | ICD-10-CM | POA: Diagnosis not present

## 2017-02-20 DIAGNOSIS — R001 Bradycardia, unspecified: Secondary | ICD-10-CM

## 2017-02-20 DIAGNOSIS — I10 Essential (primary) hypertension: Secondary | ICD-10-CM

## 2017-02-20 DIAGNOSIS — G4733 Obstructive sleep apnea (adult) (pediatric): Secondary | ICD-10-CM | POA: Diagnosis not present

## 2017-02-20 DIAGNOSIS — E782 Mixed hyperlipidemia: Secondary | ICD-10-CM

## 2017-02-20 DIAGNOSIS — Z9989 Dependence on other enabling machines and devices: Secondary | ICD-10-CM | POA: Diagnosis not present

## 2017-02-20 DIAGNOSIS — R002 Palpitations: Secondary | ICD-10-CM | POA: Diagnosis not present

## 2017-02-20 MED ORDER — LOSARTAN POTASSIUM 100 MG PO TABS: 100.0000 mg | ORAL_TABLET | Freq: Every day | ORAL | 3 refills | Status: DC

## 2017-02-20 NOTE — Patient Instructions (Addendum)
Medication Instructions:   Hold the losartan HCTZ Start losartan alone  Hold the lovastatin again for a few weeks  Labwork:  No new labs needed  Testing/Procedures:  No further testing at this time   Follow-Up: It was a pleasure seeing you in the office today. Please call us if you have new issues that need to be addressed before your next appt.  [phone]  Your physician wants you to follow-up in: 12 months.  You will receive a reminder letter in the mail two months in advance. If you don't receive a letter, please call our office to schedule the follow-up appointment.  If you need a refill on your cardiac medications before your next appointment, please call your pharmacy.

## 2017-03-23 ENCOUNTER — Other Ambulatory Visit: Payer: Self-pay | Admitting: Cardiovascular Disease

## 2017-03-23 DIAGNOSIS — R002 Palpitations: Secondary | ICD-10-CM

## 2017-03-23 DIAGNOSIS — I10 Essential (primary) hypertension: Secondary | ICD-10-CM

## 2017-03-23 MED ORDER — LOSARTAN POTASSIUM 100 MG PO TABS: 100.0000 mg | ORAL_TABLET | Freq: Every day | ORAL | 3 refills | Status: DC

## 2017-03-25 ENCOUNTER — Other Ambulatory Visit: Payer: Self-pay | Admitting: Cardiovascular Disease

## 2017-03-25 DIAGNOSIS — I10 Essential (primary) hypertension: Secondary | ICD-10-CM

## 2017-03-25 DIAGNOSIS — R002 Palpitations: Secondary | ICD-10-CM

## 2017-04-13 ENCOUNTER — Telehealth: Payer: Self-pay | Admitting: Cardiovascular Disease

## 2017-04-13 NOTE — Telephone Encounter (Signed)
Spoke with patient and advised him to reach out to his pharmacy to see if his medication was affected from the recall and if so to give us a call back for further recommendations. He verbalized understanding of instructions with no further questions at this time.

## 2017-04-13 NOTE — Telephone Encounter (Signed)
Patient has recently heard about the recall on losartan  Would like some information about it  Please call

## 2017-04-15 ENCOUNTER — Encounter (HOSPITAL_COMMUNITY): Payer: Self-pay | Admitting: Emergency Medicine

## 2017-04-15 ENCOUNTER — Emergency Department (HOSPITAL_COMMUNITY)
Admission: EM | Admit: 2017-04-15 | Discharge: 2017-04-15 | Disposition: A | Discharge status: Home / Self Care | Payer: BLUE CROSS/BLUE SHIELD | Attending: Emergency Medicine | Admitting: Emergency Medicine

## 2017-04-15 DIAGNOSIS — Z7982 Long term (current) use of aspirin: Secondary | ICD-10-CM | POA: Diagnosis not present

## 2017-04-15 DIAGNOSIS — Z87891 Personal history of nicotine dependence: Secondary | ICD-10-CM | POA: Insufficient documentation

## 2017-04-15 DIAGNOSIS — I10 Essential (primary) hypertension: Secondary | ICD-10-CM | POA: Insufficient documentation

## 2017-04-15 DIAGNOSIS — Z79899 Other long term (current) drug therapy: Secondary | ICD-10-CM | POA: Insufficient documentation

## 2017-04-15 DIAGNOSIS — M5431 Sciatica, right side: Secondary | ICD-10-CM | POA: Insufficient documentation

## 2017-04-15 DIAGNOSIS — M79604 Pain in right leg: Secondary | ICD-10-CM | POA: Diagnosis present

## 2017-04-15 MED ORDER — PREDNISONE 20 MG PO TABS: 20.0000 mg | ORAL_TABLET | Freq: Two times a day (BID) | ORAL | 0 refills | Status: DC

## 2017-04-15 MED ORDER — DIAZEPAM 5 MG PO TABS
10.0000 mg | ORAL_TABLET | Freq: Once | ORAL | Status: AC
  Administered 2017-04-15: 10 mg via ORAL
  Filled 2017-04-15: qty 2

## 2017-04-15 MED ORDER — HYDROCODONE-ACETAMINOPHEN 5-325 MG PO TABS: 1.0000 | ORAL_TABLET | ORAL | 0 refills | Status: DC | PRN

## 2017-04-15 MED ORDER — PREDNISONE 50 MG PO TABS
60.0000 mg | ORAL_TABLET | Freq: Once | ORAL | Status: AC
  Administered 2017-04-15: 60 mg via ORAL
  Filled 2017-04-15: qty 1

## 2017-04-15 MED ORDER — HYDROCODONE-ACETAMINOPHEN 5-325 MG PO TABS
2.0000 | ORAL_TABLET | Freq: Once | ORAL | Status: AC
  Administered 2017-04-15: 2 via ORAL
  Filled 2017-04-15: qty 2

## 2017-04-15 MED ORDER — DIAZEPAM 10 MG PO TABS: 10.0000 mg | ORAL_TABLET | Freq: Three times a day (TID) | ORAL | 0 refills | Status: DC | PRN

## 2017-04-15 NOTE — ED Provider Notes (Signed)
[facility] EMERGENCY DEPARTMENT Provider Note   CSN: [id number] Arrival date & time: 04/15/17  1920     History   Chief Complaint Chief Complaint  Patient presents with  . Leg Pain    HPI Carolin GuernseyMichael N Kamphuis is a 57 y.o. male.  He complains of pain in right buttocks to right knee, insidious onset 2 days ago, worsening today.  He was able to work today, but came here tonight because the pain he has had similar pain previously in both the left and right legs.  He has previously been told that he has "sciatica."  He does not see an orthopedist, currently.  He denies fever, chills, weakness or paresthesia.  There are no other known modifying factors.  HPI  Past Medical History:  Diagnosis Date  . Bruit 10/14/2009   Asymptomatic Bruit, Normal Carotid Doppler  . Chest pain 10/14/2009   Stress Test demontrated a small fixed Inferolateral perfusion defect  . Claudication (HCC) 10/14/2009   Normal Lower Arterial Doppler  . Hypertension    controlled  . Mixed dyslipidemia   . Murmur 10/31/2008   Echo EF >55%  AV apear mildly Sclerotic, No AS  . Obstructive sleep apnea    on c-pap    Patient Active Problem List   Diagnosis Date Noted  . OSA on CPAP 02/19/2017  . Bradycardia 05/13/2015  . Chest tightness 09/06/2013  . Essential hypertension 08/03/2012  . Palpitations 08/03/2012  . Hyperlipidemia 08/03/2012    Past Surgical History:  Procedure Laterality Date  . HEEL SPUR EXCISION    . Ulcers  2002   Stomach Bleeding       Home Medications    Prior to Admission medications   Medication Sig Start Date End Date Taking? Authorizing Provider  aspirin EC 81 MG tablet Take 81 mg by mouth daily.    [provider]  cyclobenzaprine (FLEXERIL) 10 MG tablet Take 10 mg by mouth 3 (three) times daily as needed for muscle spasms.    [provider]  doxazosin (CARDURA) 8 MG tablet Take 1 tablet (8 mg total) by mouth daily. 03/23/17   Antonieta IbaGollan, Timothy J, MD    losartan (COZAAR) 100 MG tablet Take 1 tablet (100 mg total) by mouth daily. 03/23/17   Antonieta IbaGollan, Timothy J, MD  lovastatin (MEVACOR) 20 MG tablet Take 1 tablet (20 mg total) by mouth at bedtime. 11/13/15   Antonieta IbaGollan, Timothy J, MD    Family History Family History  Problem Relation Age of Onset  . Cancer Father 374  . Hypertension Mother   . Cancer Sister [id number]    Social History Social History   Tobacco Use  . Smoking status: Former Smoker    Packs/day: 1.00    Years: 12.00    Pack years: 12.00    Types: Cigarettes    Last attempt to quit: 03/21/1982    Years since quitting: 35.0  . Smokeless tobacco: Never Used  Substance Use Topics  . Alcohol use: No  . Drug use: No     Allergies   Lisinopril   Review of Systems Review of Systems  All other systems reviewed and are negative.    Physical Exam Updated Vital Signs BP (!) 165/96 (BP Location: Left Arm)   Pulse (!) 57   Temp 98 F (36.7 C) (Oral)   Resp 18   Ht 6\' 1"  (1.854 m)   Wt 95.3 kg (210 lb)   SpO2 98%   BMI 27.71 kg/m   Physical  Exam  Constitutional: He is oriented to person, place, and time. He appears well-developed and well-nourished. He appears distressed (He is uncomfortable).  HENT:  Head: Normocephalic and atraumatic.  Right Ear: External ear normal.  Left Ear: External ear normal.  Eyes: Conjunctivae and EOM are normal. Pupils are equal, round, and reactive to light.  Neck: Normal range of motion and phonation normal. Neck supple.  Cardiovascular: Normal rate.  Pulmonary/Chest: Effort normal. He exhibits no bony tenderness.  Musculoskeletal: Normal range of motion.  Tender right buttock and right posterior thigh.  No altered motion, passively right hip right knee or right ankle.  He is able to straight leg, right, independently right, to 45 degrees  Neurological: He is alert and oriented to person, place, and time. No cranial nerve deficit or sensory deficit. He exhibits normal muscle tone.  Coordination normal.  Skin: Skin is warm, dry and intact.  Psychiatric: He has a normal mood and affect. His behavior is normal. Judgment and thought content normal.  Nursing note and vitals reviewed.    ED Treatments / Results  Labs (all labs ordered are listed, but only abnormal results are displayed) Labs Reviewed - No data to display  EKG  EKG Interpretation None       Radiology No results found.  Procedures Procedures (including critical care time)  Medications Ordered in ED Medications - No data to display   Initial Impression / Assessment and Plan / ED Course  I have reviewed the triage vital signs and the nursing notes.  Pertinent labs & imaging results that were available during my care of the patient were reviewed by me and considered in my medical decision making (see chart for details).      Patient Vitals for the past 24 hrs:  BP Temp Temp src Pulse Resp SpO2 Height Weight  04/15/17 1935 (!) 165/96 98 F (36.7 C) Oral (!) 57 18 98 % - -  04/15/17 1932 - - - - - - 6\' 1"  (1.854 m) 95.3 kg (210 lb)    9:13 PM Reevaluation with update and discussion. After initial assessment and treatment, an updated evaluation reveals patient states he has not had a relief of his pain, yet.  He requested "something for pain."  Norco ordered.  Findings discussed with patient and wife, all questions answered. Mancel Bale      Final Clinical Impressions(s) / ED Diagnoses   Final diagnoses:  Sciatica of right side   Evaluation consistent with recurrent sciatica.  Spinal myelopathy, fracture, cauda equina syndrome.  Nursing Notes Reviewed/ Care Coordinated Applicable Imaging Reviewed Interpretation of Laboratory Data incorporated into ED treatment  The patient appears reasonably screened and/or stabilized for discharge and I doubt any other medical condition or other [facility] requiring further screening, evaluation, or treatment in the ED at this time prior to  discharge.  Plan: Home Medications-continue usual medications; Home Treatments-rest, heat, work release for 4 days.; return here if the recommended treatment, does not improve the symptoms; Recommended follow up-orthopedic follow-up if not better in 1 week.   ED Discharge Orders    None       Mancel Bale, MD 04/15/17 2125

## 2017-04-15 NOTE — Discharge Instructions (Signed)
Use heat on the sore area 3 or 4 times a day for 30 minutes.  Rest for 2 or 3 days then do some gentle stretching exercises.  Follow-up with an orthopedist if you are not better in 1 week.

## 2017-04-15 NOTE — ED Triage Notes (Signed)
Pt with pain in R leg x 2 days. Pt states pain extends from buttocks to behind R knee.

## 2017-04-18 ENCOUNTER — Encounter: Payer: Self-pay | Admitting: Orthopaedic Surgery

## 2017-04-18 ENCOUNTER — Ambulatory Visit (INDEPENDENT_AMBULATORY_CARE_PROVIDER_SITE_OTHER): Payer: BLUE CROSS/BLUE SHIELD

## 2017-04-18 ENCOUNTER — Ambulatory Visit: Payer: BLUE CROSS/BLUE SHIELD

## 2017-04-18 ENCOUNTER — Ambulatory Visit: Payer: BLUE CROSS/BLUE SHIELD | Admitting: Orthopaedic Surgery

## 2017-04-18 VITALS — BP 184/90 | HR 68 | Temp 97.4°F | Ht 73.0 in | Wt 215.0 lb

## 2017-04-18 DIAGNOSIS — G8929 Other chronic pain: Secondary | ICD-10-CM

## 2017-04-18 DIAGNOSIS — M5441 Lumbago with sciatica, right side: Secondary | ICD-10-CM

## 2017-04-18 MED ORDER — OXYCODONE-ACETAMINOPHEN 7.5-325 MG PO TABS: ORAL_TABLET | ORAL | 0 refills | Status: DC

## 2017-04-18 MED ORDER — MELOXICAM 15 MG PO TABS: 15.0000 mg | ORAL_TABLET | Freq: Every day | ORAL | 0 refills | Status: DC

## 2017-04-18 MED ORDER — TIZANIDINE HCL 4 MG PO TABS: ORAL_TABLET | ORAL | 0 refills | Status: DC

## 2017-04-18 NOTE — Progress Notes (Signed)
Subjective:    Patient ID: Joshua Mann, male    DOB: 05/06/1960, 57 y.o.   MRN: [id number]  HPI He has had marked pain of the lumbar spine with some right sided sciatica for about a week.  He was seen in the ER about four days ago for this.  He was given Prednisone, Diazepam for this plus Norco 5.  His pain continues.  He has pain going to the right lateral foot with numbness at times.  He has no trauma.  He said he had problems with his back treated by Dr. Durene CalHunter in [place] about six months ago and then had [place] in [place] this.  His pain then was down the left side.  He got better after about two months of PT.  Now he has pain on the right side.  He has taken ibuprofen, Tylenol with no help.  He has done ice, heat and rubs with no help.  He has no weakness, no bowel or bladder problems.   Review of Systems  Musculoskeletal: Positive for arthralgias, back pain and gait problem.  All other systems reviewed and are negative.  Past Medical History:  Diagnosis Date  . Bruit 10/14/2009   Asymptomatic Bruit, Normal Carotid Doppler  . Chest pain 10/14/2009   Stress Test demontrated a small fixed Inferolateral perfusion defect  . Claudication (HCC) 10/14/2009   Normal Lower Arterial Doppler  . Hypertension    controlled  . Mixed dyslipidemia   . Murmur 10/31/2008   Echo EF >55%  AV apear mildly Sclerotic, No AS  . Obstructive sleep apnea    on c-pap    Past Surgical History:  Procedure Laterality Date  . HEEL SPUR EXCISION    . Ulcers  2002   Stomach Bleeding    Current Outpatient Medications on File Prior to Visit  Medication Sig Dispense Refill  . aspirin EC 81 MG tablet Take 81 mg by mouth daily.    . diazepam (VALIUM) 10 MG tablet Take 1 tablet (10 mg total) by mouth every 8 (eight) hours as needed (Muscle spasm). 15 tablet 0  . doxazosin (CARDURA) 8 MG tablet Take 1 tablet (8 mg total) by mouth daily. 90 tablet 3  . HYDROcodone-acetaminophen (NORCO) 5-325  MG tablet Take 1-2 tablets by mouth every 4 (four) hours as needed. 10 tablet 0  . losartan (COZAAR) 100 MG tablet Take 1 tablet (100 mg total) by mouth daily. 90 tablet 3  . lovastatin (MEVACOR) 20 MG tablet Take 1 tablet (20 mg total) by mouth at bedtime. 90 tablet 3  . predniSONE (DELTASONE) 20 MG tablet Take 1 tablet (20 mg total) by mouth 2 (two) times daily. 10 tablet 0  . VIMOVO 500-20 MG TBEC Take 1 tablet by mouth 2 (two) times daily.  3   No current facility-administered medications on file prior to visit.     Social History   Socioeconomic History  . Marital status: Married    Spouse name: Not on file  . Number of children: 2  . Years of education: Not on file  . Highest education level: Not on file  Social Needs  . Financial resource strain: Not on file  . Food insecurity - worry: Not on file  . Food insecurity - inability: Not on file  . Transportation needs - medical: Not on file  . Transportation needs - non-medical: Not on file  Occupational History  . Not on file  Tobacco Use  . Smoking status: Former  Smoker    Packs/day: 1.00    Years: 12.00    Pack years: 12.00    Types: Cigarettes    Last attempt to quit: 03/21/1982    Years since quitting: 35.1  . Smokeless tobacco: Never Used  Substance and Sexual Activity  . Alcohol use: No  . Drug use: No  . Sexual activity: Not on file  Other Topics Concern  . Not on file  Social History Narrative  . Not on file    Family History  Problem Relation Age of Onset  . Cancer Father 97  . Hypertension Mother   . Cancer Sister 62    BP (!) 184/90   Pulse 68   Temp (!) 97.4 F (36.3 C)   Ht 6\' 1"  (1.854 m)   Wt 215 lb (97.5 kg)   BMI 28.37 kg/m      Objective:   Physical Exam  Constitutional: He is oriented to person, place, and time. He appears well-developed and well-nourished.  HENT:  Head: Normocephalic and atraumatic.  Eyes: Conjunctivae and EOM are normal. Pupils are equal, round, and reactive to  light.  Neck: Normal range of motion. Neck supple.  Cardiovascular: Normal rate, regular rhythm and intact distal pulses.  Pulmonary/Chest: Effort normal.  Abdominal: Soft.  Musculoskeletal: He exhibits tenderness (Lumbar spine tender, ROM only 30 forward, lateral bend full but painful, SLR +20 on the right, reflexes normal, no spasm, strength normal.).  Neurological: He is alert and oriented to person, place, and time. He has normal reflexes. He displays normal reflexes. No cranial nerve deficit. He exhibits normal muscle tone. Coordination normal.  Skin: Skin is warm and dry.  Psychiatric: He has a normal mood and affect. His behavior is normal. Judgment and thought content normal.   X-rays were done of the lumbar spine, reported separately.       Assessment & Plan:   Encounter Diagnosis  Name Primary?  . Chronic right-sided low back pain with right-sided sciatica Yes   I need the notes from his primary care.  I will schedule PT for him this coming week.  Return in one week.  Begin Mobic, zanaflex and Percocet 7.5.  I have reviewed the [place] Controlled Substance Reporting System web site prior to prescribing narcotic medicine for this patient.  Stay out of work.  Call if any problem.  Precautions discussed. I am concerned about a HNP and he needs a MRI.  If the notes from the primary document his back pain, we can get the MRI very soon.   Electronically Signed Darreld Mclean, MD 1/29/20192:33 PM

## 2017-04-18 NOTE — Patient Instructions (Signed)
[facility] [place] [facility] [place] [facility]  is the phone number to call if you want to call to schedule. Please let us know if you do not hear anything within one week.

## 2017-04-21 ENCOUNTER — Other Ambulatory Visit: Payer: Self-pay | Admitting: Sports Medicine

## 2017-04-21 DIAGNOSIS — M5127 Other intervertebral disc displacement, lumbosacral region: Secondary | ICD-10-CM

## 2017-04-24 ENCOUNTER — Telehealth: Payer: Self-pay | Admitting: Radiology

## 2017-04-24 NOTE — Telephone Encounter (Signed)
-----   Message from Doristine Sectionarol A Foltz sent at 04/24/2017 10:15 AM EST ----- Regarding: Call from [facility] physical [facility] Amy,  RE:  Carolin GuernseyWILLIAMSON, Carsyn N [[id number]][[id number]]  Please see PT note I entered into referral, per [place] [facility].  Please advise. Ph [phone]

## 2017-04-24 NOTE — Telephone Encounter (Signed)
Patient has declined physical therapy for now, states wants to go for MRI first

## 2017-04-25 ENCOUNTER — Telehealth: Payer: Self-pay | Admitting: *Deleted

## 2017-04-25 NOTE — Telephone Encounter (Signed)
I called and left a voice message for Joshua Mann to call me.

## 2017-04-25 NOTE — Telephone Encounter (Signed)
Called patient to get information regarding prior treatment to try and  get MRI approved, and he informed me that he is seeing Dr. Landry MellowKubinski, and that he has been scheduled for MRI on 05/05/17.

## 2017-04-26 ENCOUNTER — Ambulatory Visit: Payer: BLUE CROSS/BLUE SHIELD | Admitting: Orthopaedic Surgery

## 2017-05-05 ENCOUNTER — Ambulatory Visit
Admission: RE | Admit: 2017-05-05 | Discharge: 2017-05-05 | Disposition: A | Discharge status: Home / Self Care | Payer: BLUE CROSS/BLUE SHIELD | Source: Ambulatory Visit | Attending: Sports Medicine | Admitting: Sports Medicine

## 2017-05-05 DIAGNOSIS — M5417 Radiculopathy, lumbosacral region: Secondary | ICD-10-CM | POA: Diagnosis present

## 2017-05-05 DIAGNOSIS — G544 Lumbosacral root disorders, not elsewhere classified: Secondary | ICD-10-CM | POA: Diagnosis not present

## 2017-05-05 DIAGNOSIS — M7918 Myalgia, other site: Secondary | ICD-10-CM | POA: Diagnosis present

## 2017-05-05 DIAGNOSIS — M5127 Other intervertebral disc displacement, lumbosacral region: Secondary | ICD-10-CM | POA: Diagnosis not present

## 2017-05-10 ENCOUNTER — Other Ambulatory Visit: Payer: Self-pay | Admitting: Sports Medicine

## 2017-05-10 DIAGNOSIS — M5127 Other intervertebral disc displacement, lumbosacral region: Secondary | ICD-10-CM

## 2017-05-16 ENCOUNTER — Ambulatory Visit
Admission: RE | Admit: 2017-05-16 | Discharge: 2017-05-16 | Disposition: A | Discharge status: Home / Self Care | Payer: BLUE CROSS/BLUE SHIELD | Source: Ambulatory Visit | Attending: Sports Medicine | Admitting: Sports Medicine

## 2017-05-16 DIAGNOSIS — M5127 Other intervertebral disc displacement, lumbosacral region: Secondary | ICD-10-CM

## 2017-05-16 MED ORDER — IOPAMIDOL (ISOVUE-M 200) INJECTION 41%
1.0000 mL | Freq: Once | INTRAMUSCULAR | Status: AC
  Administered 2017-05-16: 1 mL via EPIDURAL

## 2017-05-16 MED ORDER — METHYLPREDNISOLONE ACETATE 40 MG/ML INJ SUSP (RADIOLOG
120.0000 mg | Freq: Once | INTRAMUSCULAR | Status: AC
  Administered 2017-05-16: 120 mg via EPIDURAL

## 2017-05-16 NOTE — Discharge Instructions (Signed)

## 2017-05-25 ENCOUNTER — Telehealth: Payer: Self-pay | Admitting: Cardiovascular Disease

## 2017-05-25 NOTE — Telephone Encounter (Signed)
Routing to Dr Mariah MillingGollan for review. Patient is on aspirin.

## 2017-05-25 NOTE — Telephone Encounter (Signed)
° °  [facility] Pre-operative Risk Assessment    Request for surgical clearance:  1. What type of surgery is being performed? Right L5-S1 Microdiscectomy    2. When is this surgery scheduled? 06-07-17   3. What type of clearance is required (medical clearance vs. Pharmacy clearance to hold med vs. Both)? Both   4. Are there any medications that need to be held prior to surgery and how long?unknown please advise    5. Practice name and name of physician performing surgery? [facility] Dr. Verita Schneiders    6. What is your office phone and fax number? [phone] fax [phone]    7. Anesthesia type (None, local, MAC, general) ? unknown   Clarisse Gouge 05/25/2017, 1:14 PM  _________________________________________________________________   (provider comments below)

## 2017-05-26 NOTE — Telephone Encounter (Signed)
Acceptable risk for procedure No further testing needed He can hold aspirin

## 2017-05-26 NOTE — Telephone Encounter (Signed)
Copy of this phone encounter was faxed to neurosurgery at [phone]. Confirmation received.

## 2017-06-01 ENCOUNTER — Inpatient Hospital Stay: Admission: RE | Admit: 2017-06-01 | Payer: BLUE CROSS/BLUE SHIELD | Source: Ambulatory Visit

## 2017-06-02 ENCOUNTER — Encounter
Admission: RE | Admit: 2017-06-02 | Discharge: 2017-06-02 | Disposition: A | Discharge status: Home / Self Care | Payer: BLUE CROSS/BLUE SHIELD | Source: Ambulatory Visit | Attending: Neurosurgery | Admitting: Neurosurgery

## 2017-06-02 ENCOUNTER — Other Ambulatory Visit: Payer: Self-pay

## 2017-06-02 DIAGNOSIS — Z01818 Encounter for other preprocedural examination: Secondary | ICD-10-CM | POA: Insufficient documentation

## 2017-06-02 DIAGNOSIS — M5416 Radiculopathy, lumbar region: Secondary | ICD-10-CM | POA: Diagnosis present

## 2017-06-02 LAB — BASIC METABOLIC PANEL
ANION GAP: 9 (ref 5–15)
BUN: 18 mg/dL (ref 6–20)
CHLORIDE: 107 mmol/L (ref 101–111)
CO2: 25 mmol/L (ref 22–32)
Calcium: 9.2 mg/dL (ref 8.9–10.3)
Creatinine, Ser: 1.07 mg/dL (ref 0.61–1.24)
GFR calc non Af Amer: >60 mL/min (ref >60)
Glucose, Bld: 104 mg/dL — ABNORMAL HIGH (ref 65–99)
Potassium: 4.1 mmol/L (ref 3.5–5.1)
SODIUM: 141 mmol/L (ref 135–145)

## 2017-06-02 LAB — SURGICAL PCR SCREEN
MRSA, PCR: NEGATIVE
Staphylococcus aureus: NEGATIVE

## 2017-06-02 LAB — DIFFERENTIAL
Basophils Absolute: 0.1 10*3/uL (ref 0–0.1)
Basophils Relative: 1 %
EOS PCT: 2 %
Eosinophils Absolute: 0.1 10*3/uL (ref 0–0.7)
LYMPHS PCT: 19 %
Lymphs Abs: 1.3 10*3/uL (ref 1.0–3.6)
Monocytes Absolute: 0.7 10*3/uL (ref 0.2–1.0)
Monocytes Relative: 10 %
Neutro Abs: 4.5 10*3/uL (ref 1.4–6.5)
Neutrophils Relative %: 68 %

## 2017-06-02 LAB — URINALYSIS, ROUTINE W REFLEX MICROSCOPIC
BILIRUBIN URINE: NEGATIVE
GLUCOSE, UA: NEGATIVE mg/dL
Hgb urine dipstick: NEGATIVE
KETONES UR: NEGATIVE mg/dL
Leukocytes, UA: NEGATIVE
NITRITE: NEGATIVE
Protein, ur: NEGATIVE mg/dL
Specific Gravity, Urine: 1.021 (ref 1.005–1.030)
pH: 5 (ref 5.0–8.0)

## 2017-06-02 LAB — CBC
HEMATOCRIT: 43.8 % (ref 40.0–52.0)
HEMOGLOBIN: 14 g/dL (ref 13.0–18.0)
MCH: 26.4 pg (ref 26.0–34.0)
MCHC: 32 g/dL (ref 32.0–36.0)
MCV: 82.3 fL (ref 80.0–100.0)
Platelets: 211 10*3/uL (ref 150–440)
RBC: 5.32 MIL/uL (ref 4.40–5.90)
RDW: 13.8 % (ref 11.5–14.5)
WBC: 6.7 10*3/uL (ref 3.8–10.6)

## 2017-06-02 LAB — PROTIME-INR
INR: 1.01
PROTHROMBIN TIME: 13.2 s (ref 11.4–15.2)

## 2017-06-02 LAB — APTT: aPTT: 28 seconds (ref 24–36)

## 2017-06-02 NOTE — Patient Instructions (Signed)
Your procedure is scheduled on: Wednesday 06/07/17 Report to [facility]. To find out your arrival time please call [phone] between 1PM - 3PM on Tuesday 06/06/17.  Remember: Instructions that are not followed completely may result in serious medical risk, up to and including death, or upon the discretion of your surgeon and anesthesiologist your surgery may need to be rescheduled.     _X__ 1. Do not eat food after midnight the night before your procedure.                 No gum chewing or hard candies. You may drink clear liquids up to 2 hours                 before you are scheduled to arrive for your surgery- DO not drink clear                 liquids within 2 hours of the start of your surgery.                 Clear Liquids include:  water, apple juice without pulp, clear carbohydrate                 drink such as Clearfast or Gatorade, Black Coffee or Tea (Do not add                 anything to coffee or tea).  __X__2.  On the morning of surgery brush your teeth with toothpaste and water, you                 may rinse your mouth with mouthwash if you wish.  Do not swallow any              toothpaste of mouthwash.     _X__ 3.  No Alcohol for 24 hours before or after surgery.   _X__ 4.  Do Not Smoke or use e-cigarettes For 24 Hours Prior to Your Surgery.                 Do not use any chewable tobacco products for at least 6 hours prior to                 surgery.  ____  5.  Bring all medications with you on the day of surgery if instructed.   __X__  6.  Notify your doctor if there is any change in your medical condition      (cold, fever, infections).     Do not wear jewelry, make-up, hairpins, clips or nail polish. Do not wear lotions, powders, or perfumes.  Do not shave 48 hours prior to surgery. Men may shave face and neck. Do not bring valuables to the hospital.    [facility] is not responsible for any belongings or  valuables.  Contacts, dentures/partials or body piercings may not be worn into surgery. Bring a case for your contacts, glasses or hearing aids, a denture cup will be supplied. Leave your suitcase in the car. After surgery it may be brought to your room. For patients admitted to the hospital, discharge time is determined by your treatment team.   Patients discharged the day of surgery will not be allowed to drive home.   Please read over the following fact sheets that you were given:   MRSA Information  __X__ Take these medicines the morning of surgery with A SIP OF WATER:  1. none  2.   3.   4.  5.  6.  ____ Fleet Enema (as directed)   __X__ Use CHG Soap/SAGE wipes as directed  ____ Use inhalers on the day of surgery  ____ Stop metformin/Janumet/Farxiga 2 days prior to surgery    ____ Take 1/2 of usual insulin dose the night before surgery. No insulin the morning          of surgery.   __X__ Stop Blood Thinners Coumadin/Plavix/Xarelto/Pleta/Pradaxa/Eliquis/Effient/Aspirin on TODAY     Or contact your Surgeon, Cardiologist or Medical Doctor regarding  ability to stop your blood thinners  __X__ Stop Anti-inflammatories 7 days before surgery such as Advil, Ibuprofen, Motrin,  BC or Goodies Powder, Naprosyn, Naproxen, Aleve, Aspirin    __X__ Stop all herbal supplements, fish oil or vitamin E until after surgery.    ____ Bring C-Pap to the hospital.

## 2017-06-07 ENCOUNTER — Encounter: Payer: Self-pay | Admitting: Emergency Medicine

## 2017-06-07 ENCOUNTER — Ambulatory Visit: Payer: BLUE CROSS/BLUE SHIELD

## 2017-06-07 ENCOUNTER — Ambulatory Visit: Payer: BLUE CROSS/BLUE SHIELD | Admitting: Certified Registered Nurse Anesthetist

## 2017-06-07 ENCOUNTER — Ambulatory Visit
Admission: RE | Admit: 2017-06-07 | Discharge: 2017-06-07 | Disposition: A | Discharge status: Home / Self Care | Payer: BLUE CROSS/BLUE SHIELD | Source: Ambulatory Visit | Attending: Neurosurgery | Admitting: Neurosurgery

## 2017-06-07 ENCOUNTER — Encounter
Admission: RE | Disposition: A | Discharge status: Home / Self Care | Payer: Self-pay | Source: Ambulatory Visit | Attending: Neurosurgery

## 2017-06-07 DIAGNOSIS — M5117 Intervertebral disc disorders with radiculopathy, lumbosacral region: Secondary | ICD-10-CM | POA: Insufficient documentation

## 2017-06-07 DIAGNOSIS — I10 Essential (primary) hypertension: Secondary | ICD-10-CM | POA: Diagnosis not present

## 2017-06-07 DIAGNOSIS — Z79899 Other long term (current) drug therapy: Secondary | ICD-10-CM | POA: Diagnosis not present

## 2017-06-07 DIAGNOSIS — G4730 Sleep apnea, unspecified: Secondary | ICD-10-CM | POA: Diagnosis not present

## 2017-06-07 DIAGNOSIS — Z87891 Personal history of nicotine dependence: Secondary | ICD-10-CM | POA: Insufficient documentation

## 2017-06-07 DIAGNOSIS — Z419 Encounter for procedure for purposes other than remedying health state, unspecified: Secondary | ICD-10-CM

## 2017-06-07 HISTORY — PX: LUMBAR LAMINECTOMY/DECOMPRESSION MICRODISCECTOMY: SHX5026

## 2017-06-07 LAB — ABO/RH: ABO/RH(D): B POS

## 2017-06-07 LAB — TYPE AND SCREEN
ABO/RH(D): B POS
ANTIBODY SCREEN: NEGATIVE

## 2017-06-07 SURGERY — LUMBAR LAMINECTOMY/DECOMPRESSION MICRODISCECTOMY 1 LEVEL
Anesthesia: General | Site: Spine Lumbar | Laterality: Right | Wound class: Clean

## 2017-06-07 MED ORDER — MIDAZOLAM HCL 2 MG/2ML IJ SOLN
INTRAMUSCULAR | Status: AC
  Filled 2017-06-07: qty 2

## 2017-06-07 MED ORDER — OXYCODONE HCL 5 MG PO TABS: 5.0000 mg | ORAL_TABLET | Freq: Once | ORAL | Status: DC | PRN

## 2017-06-07 MED ORDER — ACETAMINOPHEN 10 MG/ML IV SOLN
INTRAVENOUS | Status: AC
  Filled 2017-06-07: qty 100

## 2017-06-07 MED ORDER — MEPERIDINE HCL 50 MG/ML IJ SOLN: 6.2500 mg | INTRAMUSCULAR | Status: DC | PRN

## 2017-06-07 MED ORDER — PROPOFOL 10 MG/ML IV BOLUS
INTRAVENOUS | Status: DC | PRN
  Administered 2017-06-07: 150 mg via INTRAVENOUS

## 2017-06-07 MED ORDER — PROMETHAZINE HCL 25 MG/ML IJ SOLN: 6.2500 mg | INTRAMUSCULAR | Status: DC | PRN

## 2017-06-07 MED ORDER — LIDOCAINE HCL (PF) 2 % IJ SOLN
INTRAMUSCULAR | Status: AC
  Filled 2017-06-07: qty 10

## 2017-06-07 MED ORDER — ONDANSETRON HCL 4 MG/2ML IJ SOLN
INTRAMUSCULAR | Status: DC | PRN
  Administered 2017-06-07: 4 mg via INTRAVENOUS

## 2017-06-07 MED ORDER — METHOCARBAMOL 500 MG PO TABS: 500.0000 mg | ORAL_TABLET | Freq: Four times a day (QID) | ORAL | 0 refills | Status: DC | PRN

## 2017-06-07 MED ORDER — FAMOTIDINE 20 MG PO TABS
ORAL_TABLET | ORAL | Status: AC
  Administered 2017-06-07: 20 mg via ORAL
  Filled 2017-06-07: qty 1

## 2017-06-07 MED ORDER — SODIUM CHLORIDE 0.9 % IV SOLN
INTRAVENOUS | Status: DC | PRN
  Administered 2017-06-07: 40 mL

## 2017-06-07 MED ORDER — FAMOTIDINE 20 MG PO TABS
20.0000 mg | ORAL_TABLET | Freq: Once | ORAL | Status: AC
  Administered 2017-06-07: 20 mg via ORAL

## 2017-06-07 MED ORDER — FENTANYL CITRATE (PF) 100 MCG/2ML IJ SOLN
INTRAMUSCULAR | Status: DC | PRN
  Administered 2017-06-07: 50 ug via INTRAVENOUS
  Administered 2017-06-07: 100 ug via INTRAVENOUS

## 2017-06-07 MED ORDER — DEXAMETHASONE SODIUM PHOSPHATE 10 MG/ML IJ SOLN
INTRAMUSCULAR | Status: AC
  Filled 2017-06-07: qty 1

## 2017-06-07 MED ORDER — SUCCINYLCHOLINE CHLORIDE 20 MG/ML IJ SOLN
INTRAMUSCULAR | Status: DC | PRN
  Administered 2017-06-07: 100 mg via INTRAVENOUS

## 2017-06-07 MED ORDER — LIDOCAINE HCL (CARDIAC) 20 MG/ML IV SOLN
INTRAVENOUS | Status: DC | PRN
  Administered 2017-06-07: 100 mg via INTRAVENOUS

## 2017-06-07 MED ORDER — HYDROMORPHONE HCL 1 MG/ML IJ SOLN
INTRAMUSCULAR | Status: DC
Start: 2017-06-07 — End: 2017-06-07
  Filled 2017-06-07: qty 1

## 2017-06-07 MED ORDER — HYDROMORPHONE HCL 1 MG/ML IJ SOLN
INTRAMUSCULAR | Status: AC
  Administered 2017-06-07: 0.5 mg via INTRAVENOUS
  Filled 2017-06-07: qty 1

## 2017-06-07 MED ORDER — METHYLPREDNISOLONE ACETATE 40 MG/ML IJ SUSP
INTRAMUSCULAR | Status: DC | PRN
  Administered 2017-06-07: 40 mg

## 2017-06-07 MED ORDER — OXYCODONE HCL 5 MG PO TABS
5.0000 mg | ORAL_TABLET | ORAL | Status: DC | PRN
  Administered 2017-06-07: 5 mg via ORAL

## 2017-06-07 MED ORDER — SODIUM CHLORIDE 0.9 % IV SOLN
INTRAVENOUS | Status: DC | PRN
  Administered 2017-06-07: 30 ug/min via INTRAVENOUS

## 2017-06-07 MED ORDER — BUPIVACAINE-EPINEPHRINE (PF) 0.5% -1:200000 IJ SOLN
INTRAMUSCULAR | Status: DC | PRN
  Administered 2017-06-07: 4 mL

## 2017-06-07 MED ORDER — ACETAMINOPHEN 10 MG/ML IV SOLN
INTRAVENOUS | Status: DC | PRN
  Administered 2017-06-07: 1000 mg via INTRAVENOUS

## 2017-06-07 MED ORDER — CEFAZOLIN SODIUM-DEXTROSE 2-3 GM-%(50ML) IV SOLR
INTRAVENOUS | Status: DC | PRN
  Administered 2017-06-07: 2 g via INTRAVENOUS

## 2017-06-07 MED ORDER — KETAMINE HCL 50 MG/ML IJ SOLN
INTRAMUSCULAR | Status: DC | PRN
  Administered 2017-06-07: 50 mg via INTRAMUSCULAR

## 2017-06-07 MED ORDER — HYDROMORPHONE HCL 1 MG/ML IJ SOLN
0.2500 mg | INTRAMUSCULAR | Status: DC | PRN
  Administered 2017-06-07 (×3): 0.5 mg via INTRAVENOUS

## 2017-06-07 MED ORDER — OXYCODONE HCL 5 MG/5ML PO SOLN: 5.0000 mg | Freq: Once | ORAL | Status: DC | PRN

## 2017-06-07 MED ORDER — SODIUM CHLORIDE 0.9 % IR SOLN
Status: DC | PRN
  Administered 2017-06-07: 1000 mL

## 2017-06-07 MED ORDER — CEFAZOLIN SODIUM-DEXTROSE 2-4 GM/100ML-% IV SOLN: 2.0000 g | Freq: Once | INTRAVENOUS | Status: DC

## 2017-06-07 MED ORDER — PHENYLEPHRINE HCL 10 MG/ML IJ SOLN
INTRAMUSCULAR | Status: DC | PRN
  Administered 2017-06-07: 200 ug via INTRAVENOUS

## 2017-06-07 MED ORDER — ROCURONIUM BROMIDE 100 MG/10ML IV SOLN
INTRAVENOUS | Status: DC | PRN
  Administered 2017-06-07: 10 mg via INTRAVENOUS

## 2017-06-07 MED ORDER — MIDAZOLAM HCL 2 MG/2ML IJ SOLN
INTRAMUSCULAR | Status: DC | PRN
  Administered 2017-06-07: 2 mg via INTRAVENOUS

## 2017-06-07 MED ORDER — ONDANSETRON HCL 4 MG/2ML IJ SOLN
INTRAMUSCULAR | Status: AC
  Filled 2017-06-07: qty 2

## 2017-06-07 MED ORDER — LACTATED RINGERS IV SOLN
INTRAVENOUS | Status: DC
  Administered 2017-06-07: 11:00:00 via INTRAVENOUS

## 2017-06-07 MED ORDER — PROPOFOL 10 MG/ML IV BOLUS
INTRAVENOUS | Status: AC
  Filled 2017-06-07: qty 20

## 2017-06-07 MED ORDER — OXYCODONE HCL 5 MG PO TABS
ORAL_TABLET | ORAL | Status: AC
  Filled 2017-06-07: qty 1

## 2017-06-07 MED ORDER — DEXAMETHASONE SODIUM PHOSPHATE 10 MG/ML IJ SOLN
INTRAMUSCULAR | Status: DC | PRN
  Administered 2017-06-07: 10 mg via INTRAVENOUS

## 2017-06-07 MED ORDER — THROMBIN (RECOMBINANT) 5000 UNITS EX SOLR
CUTANEOUS | Status: DC | PRN
Start: 2017-06-07 — End: 2017-06-07
  Administered 2017-06-07: 5000 [IU] via TOPICAL

## 2017-06-07 MED ORDER — FENTANYL CITRATE (PF) 250 MCG/5ML IJ SOLN
INTRAMUSCULAR | Status: AC
  Filled 2017-06-07: qty 5

## 2017-06-07 MED ORDER — CEFAZOLIN SODIUM-DEXTROSE 2-4 GM/100ML-% IV SOLN
INTRAVENOUS | Status: AC
  Filled 2017-06-07: qty 100

## 2017-06-07 MED ORDER — ROCURONIUM BROMIDE 50 MG/5ML IV SOLN
INTRAVENOUS | Status: AC
  Filled 2017-06-07: qty 1

## 2017-06-07 MED ORDER — BUPIVACAINE HCL 0.5 % IJ SOLN
INTRAMUSCULAR | Status: DC | PRN
  Administered 2017-06-07: 20 mL

## 2017-06-07 MED ORDER — OXYCODONE HCL 5 MG PO TABS: 5.0000 mg | ORAL_TABLET | ORAL | 0 refills | Status: DC | PRN

## 2017-06-07 MED ORDER — SUCCINYLCHOLINE CHLORIDE 20 MG/ML IJ SOLN
INTRAMUSCULAR | Status: AC
  Filled 2017-06-07: qty 1

## 2017-06-07 SURGICAL SUPPLY — 68 items
ADH SKN CLS APL DERMABOND .7 (GAUZE/BANDAGES/DRESSINGS) ×1
AGENT HMST MTR 8 SURGIFLO (HEMOSTASIS) ×1
BLADE BOVIE TIP EXT 4 (BLADE) ×2 IMPLANT
BUR NEURO DRILL SOFT 3.0X3.8M (BURR) ×2 IMPLANT
CANISTER SUCT 1200ML W/VALVE (MISCELLANEOUS) ×4 IMPLANT
CHLORAPREP W/TINT 26ML (MISCELLANEOUS) ×4 IMPLANT
CNTNR SPEC 2.5X3XGRAD LEK (MISCELLANEOUS) ×1
CONT SPEC 4OZ STER OR WHT (MISCELLANEOUS) ×1
CONT SPEC 4OZ STRL OR WHT (MISCELLANEOUS) ×1
CONTAINER SPEC 2.5X3XGRAD LEK (MISCELLANEOUS) ×1 IMPLANT
COUNTER NEEDLE 20/40 LG (NEEDLE) ×2 IMPLANT
COVER LIGHT HANDLE STERIS (MISCELLANEOUS) ×4 IMPLANT
CUP MEDICINE 2OZ PLAST GRAD ST (MISCELLANEOUS) ×4 IMPLANT
DERMABOND ADVANCED (GAUZE/BANDAGES/DRESSINGS) ×1
DERMABOND ADVANCED .7 DNX12 (GAUZE/BANDAGES/DRESSINGS) ×1 IMPLANT
DRAPE C-ARM 42X72 X-RAY (DRAPES) ×4 IMPLANT
DRAPE LAPAROTOMY 100X77 ABD (DRAPES) ×2 IMPLANT
DRAPE MICROSCOPE SPINE 48X150 (DRAPES) ×2 IMPLANT
DRAPE POUCH INSTRU U-SHP 10X18 (DRAPES) ×2 IMPLANT
DRAPE SURG 17X11 SM STRL (DRAPES) ×8 IMPLANT
ELECT CAUTERY BLADE TIP 2.5 (TIP) ×2
ELECT EZSTD 165MM 6.5IN (MISCELLANEOUS)
ELECT REM PT RETURN 9FT ADLT (ELECTROSURGICAL) ×2
ELECTRODE CAUTERY BLDE TIP 2.5 (TIP) ×1 IMPLANT
ELECTRODE EZSTD 165MM 6.5IN (MISCELLANEOUS) IMPLANT
ELECTRODE REM PT RTRN 9FT ADLT (ELECTROSURGICAL) ×1 IMPLANT
EVICEL AIRLESS SPRAY ACCES (MISCELLANEOUS) IMPLANT
FRAME EYE SHIELD (PROTECTIVE WEAR) ×2 IMPLANT
GLOVE BIO SURGEON STRL SZ 6.5 (GLOVE) ×4 IMPLANT
GLOVE BIOGEL PI IND STRL 7.0 (GLOVE) ×2 IMPLANT
GLOVE BIOGEL PI INDICATOR 7.0 (GLOVE) ×2
GLOVE SURG SYN 6.5 ES PF (GLOVE) ×4 IMPLANT
GLOVE SURG SYN 6.5 PF PI (GLOVE) ×2 IMPLANT
GLOVE SURG SYN 8.5  E (GLOVE) ×3
GLOVE SURG SYN 8.5 E (GLOVE) ×3 IMPLANT
GLOVE SURG SYN 8.5 PF PI (GLOVE) ×3 IMPLANT
GOWN SRG XL LVL 3 NONREINFORCE (GOWNS) ×1 IMPLANT
GOWN STRL NON-REIN TWL XL LVL3 (GOWNS) ×2
GOWN STRL REUS W/ TWL LRG LVL3 (GOWN DISPOSABLE) ×1 IMPLANT
GOWN STRL REUS W/TWL LRG LVL3 (GOWN DISPOSABLE) ×2
GOWN STRL REUS W/TWL MED LVL3 (GOWN DISPOSABLE) ×2 IMPLANT
GRADUATE 1200CC STRL 31836 (MISCELLANEOUS) ×2 IMPLANT
KIT SPINAL PRONEVIEW (KITS) ×2 IMPLANT
KNIFE BAYONET SHORT DISCETOMY (MISCELLANEOUS) IMPLANT
MARKER SKIN DUAL TIP RULER LAB (MISCELLANEOUS) ×6 IMPLANT
NDL SAFETY ECLIPSE 18X1.5 (NEEDLE) ×1 IMPLANT
NEEDLE HYPO 18GX1.5 SHARP (NEEDLE) ×2
NEEDLE HYPO 22GX1.5 SAFETY (NEEDLE) ×2 IMPLANT
NS IRRIG 1000ML POUR BTL (IV SOLUTION) ×2 IMPLANT
PACK LAMINECTOMY NEURO (CUSTOM PROCEDURE TRAY) ×2 IMPLANT
PAD ARMBOARD 7.5X6 YLW CONV (MISCELLANEOUS) ×2 IMPLANT
PATTIES SURGICAL .5X1.5 (GAUZE/BANDAGES/DRESSINGS) IMPLANT
SPOGE SURGIFLO 8M (HEMOSTASIS) ×1
SPONGE SURGIFLO 8M (HEMOSTASIS) ×1 IMPLANT
SUT DVC VLOC 3-0 CL 6 P-12 (SUTURE) ×2 IMPLANT
SUT NURALON 4 0 TR CR/8 (SUTURE) IMPLANT
SUT VIC AB 0 CT1 27 (SUTURE)
SUT VIC AB 0 CT1 27XCR 8 STRN (SUTURE) IMPLANT
SUT VIC AB 2-0 CT1 18 (SUTURE) ×2 IMPLANT
SUT VICRYL 0 AB UR-6 (SUTURE) ×2 IMPLANT
SYR 10ML LL (SYRINGE) ×2 IMPLANT
SYR 20CC LL (SYRINGE) ×2 IMPLANT
SYR 30ML LL (SYRINGE) ×4 IMPLANT
SYR 3ML LL SCALE MARK (SYRINGE) ×2 IMPLANT
TOWEL OR 17X26 4PK STRL BLUE (TOWEL DISPOSABLE) ×8 IMPLANT
TUBE MATRX SPINL 18MM 6CM DISP (INSTRUMENTS) ×2
TUBE METRX SPINAL 18X6 DISP (INSTRUMENTS) IMPLANT
TUBING CONNECTING 10 (TUBING) ×2 IMPLANT

## 2017-06-07 NOTE — OR Nursing (Signed)
Ivar DrapeAmanda Ferri, PA in to see pt @ 1556.

## 2017-06-07 NOTE — Anesthesia Preprocedure Evaluation (Signed)
Anesthesia Evaluation  Patient identified by MRN, date of birth, ID band Patient awake    Reviewed: Allergy & Precautions, H&P , NPO status , reviewed documented beta blocker date and time   Airway Mallampati: III  TM Distance: >3 FB     Dental  (+) Missing   Pulmonary sleep apnea and Continuous Positive Airway Pressure Ventilation , former smoker,    Pulmonary exam normal        Cardiovascular hypertension, Normal cardiovascular exam+ dysrhythmias + Valvular Problems/Murmurs   Hx murmur only   Neuro/Psych negative neurological ROS     GI/Hepatic negative GI ROS, Neg liver ROS,   Endo/Other  negative endocrine ROS  Renal/GU negative Renal ROS  negative genitourinary   Musculoskeletal   Abdominal   Peds  Hematology negative hematology ROS (+)   Anesthesia Other Findings   Reproductive/Obstetrics                             Anesthesia Physical Anesthesia Plan  ASA: II  Anesthesia Plan: General ETT   Post-op Pain Management:    Induction:   PONV Risk Score and Plan: 2 and Propofol infusion  Airway Management Planned:   Additional Equipment:   Intra-op Plan:   Post-operative Plan:   Informed Consent: I have reviewed the patients History and Physical, chart, labs and discussed the procedure including the risks, benefits and alternatives for the proposed anesthesia with the patient or authorized representative who has indicated his/her understanding and acceptance.   Dental Advisory Given  Plan Discussed with: CRNA  Anesthesia Plan Comments:         Anesthesia Quick Evaluation

## 2017-06-07 NOTE — Discharge Instructions (Addendum)
°Your surgeon has performed an operation on your lumbar spine (low back) to relieve pressure on one or more nerves. Many times, patients feel better immediately after surgery and can “overdo it.” Even if you feel well, it is important that you follow these activity guidelines. If you do not let your back heal properly from the surgery, you can increase the chance of a disc herniation and/or return of your symptoms. The following are instructions to help in your recovery once you have been discharged from the hospital. ° °Activity  °  °No bending, lifting, or twisting (“BLT”). Avoid lifting objects heavier than 10 pounds (gallon milk jug).  Where possible, avoid household activities that involve lifting, bending, pushing, or pulling such as laundry, vacuuming, grocery shopping, and childcare. Try to arrange for help from friends and family for these activities while your back heals. ° °Increase physical activity slowly as tolerated.  Taking short walks is encouraged, but avoid strenuous exercise. Do not jog, run, bicycle, lift weights, or participate in any other exercises unless specifically allowed by your doctor. Avoid prolonged sitting, including car rides. ° °Talk to your doctor before resuming sexual activity. ° °You should not drive until cleared by your doctor. ° °Until released by your doctor, you should not return to work or school.  You should rest at home and let your body heal.  ° °You may shower two days after your surgery.  After showering, lightly dab your incision dry. Do not take a tub bath or go swimming for 3 weeks, or until approved by your doctor at your follow-up appointment. ° °If you smoke, we strongly recommend that you quit.  Smoking has been proven to interfere with normal healing in your back and will dramatically reduce the success rate of your surgery. Please contact [facility] (800-QUIT-NOW) and use the resources at www.[facility].com for assistance in stopping smoking. ° °Surgical  Incision °  °If you have a dressing on your incision, you may remove it three days after your surgery. Keep your incision area clean and dry. ° °If you have staples or stitches on your incision, you should have a follow up scheduled for removal. If you do not have staples or stitches, you will have steri-strips (small pieces of surgical tape) or Dermabond glue. The steri-strips/glue should begin to peel away within about a week (it is fine if the steri-strips fall off before then). If the strips are still in place one week after your surgery, you may gently remove them. ° °Diet          ° ° You may return to your usual diet. Be sure to stay hydrated. ° °When to Contact Us ° °Although your surgery and recovery will likely be uneventful, you may have some residual numbness, aches, and pains in your back and/or legs. This is normal and should improve in the next few weeks. ° °However, should you experience any of the following, contact us immediately: °• New numbness or weakness °• Pain that is progressively getting worse, and is not relieved by your pain medications or rest °• Bleeding, redness, swelling, pain, or drainage from surgical incision °• Chills or flu-like symptoms °• Fever greater than 101.0 F (38.3 C) °• Problems with bowel or bladder functions °• Difficulty breathing or shortness of breath °• Warmth, tenderness, or swelling in your calf ° °Contact Information °• During office hours (Monday-Friday 9 am to 5 pm), please call your physician at [phone] °• After hours and weekends, please call the [facility]   Operator at [phone] and ask for the Neurosurgery Resident On Call  °• For a life-threatening emergency, call 911 ° ° ° °AMBULATORY SURGERY  °DISCHARGE INSTRUCTIONS ° ° °1) The drugs that you were given will stay in your system until tomorrow so for the next 24 hours you should not: ° °A) Drive an automobile °B) Make any legal decisions °C) Drink any alcoholic beverage ° ° °2) You may resume regular  meals tomorrow.  Today it is better to start with liquids and gradually work up to solid foods. ° °You may eat anything you prefer, but it is better to start with liquids, then soup and crackers, and gradually work up to solid foods. ° ° °3) Please notify your doctor immediately if you have any unusual bleeding, trouble breathing, redness and pain at the surgery site, drainage, fever, or pain not relieved by medication. ° ° ° °4) Additional Instructions: ° ° ° ° ° ° ° °Please contact your physician with any problems or Same Day Surgery at [phone], Monday through Friday 6 am to 4 pm, or [facility] Main number at [phone]. °

## 2017-06-07 NOTE — Op Note (Signed)
Indications: Mr. Joshua Mann is a 57 yo male who presented with lumbar radiculopathy that did not improve with conservative management.    Findings: lateral disc herniation at L5-S1 compressing the S1 nerve root  Preoperative Diagnosis: Lumbar radiculopathy Postoperative Diagnosis: same   EBL: 25 ml IVF: 600 ml Drains: none Disposition: Extubated and Stable to PACU Complications: none  No foley catheter was placed.   Preoperative Note:   Risks of surgery discussed include: infection, bleeding, stroke, coma, death, paralysis, CSF leak, nerve/spinal cord injury, numbness, tingling, weakness, complex regional pain syndrome, recurrent stenosis and/or disc herniation, vascular injury, development of instability, neck/back pain, need for further surgery, persistent symptoms, development of deformity, and the risks of anesthesia. The patient understood these risks and agreed to proceed.  Operative Note:    The patient was then brought from the preoperative center with intravenous access established.  The patient underwent general anesthesia and endotracheal tube intubation, and was then rotated on the Perry rail top where all pressure points were appropriately padded.  The skin was then thoroughly cleansed.  Perioperative antibiotic prophylaxis was administered.  Sterile prep and drapes were then applied and a timeout was then observed.  C-arm was brought into the field under sterile conditions, and the L5-S1 disc space identified and marked with an incision on the right 1cm lateral to midline.  Once this was complete a 2 cm incision was opened with the use of a #10 blade knife.  The metrics tubes were sequentially advanced under lateral fluoroscopy until a 18 x 60 mm Metrix tube was placed over the facet and lamina and secured to the bed.    The microscope was then sterilely brought into the field and muscle creep was hemostased with a bipolar and resected with a pituitary rongeur.  A Bovie  extender was then used to expose the spinous process and lamina.  Careful attention was placed to not violate the facet capsule. A 3 mm matchstick drill bit was then used to make a hemi-laminotomy trough until the ligamentum flavum was exposed.  This was extended to the base of the spinous process.  Once this was complete and the underlying ligamentum flavum was visualized this was dissected with an up angle curette and resected with a #2 and #3 mm biting Kerrison.  The laminotomy opening was also expanded in similar fashion and hemostasis was obtained with Surgifoam and a patty as well as bone wax.  The rostral aspect of the caudal level of the lamina was also resected with a #2 biting Kerrison effort to further enhance exposure.  Once the underlying dura was visualized a Penfield 4 was then used to dissect and expose the traversing nerve root.  Once this was identified a nerve root retractor suction was used to mobilize this medially.  The venous plexus was hemostased with Surgifoam and light bipolar use.  The disc material was palpable with a noticeable annular defect.    The disc herniation was identified and dissected free using a balltip probe. The pituitary rongeur was used to remove the extruded disc fragments. Once the thecal sac and nerve root were noted to be relaxed and under less tension the ball-tipped feeler was passed along the foramen distally to to ensure no residual compression was noted.    A Depo-Medrol soaked Gelfoam pledget was placed along the nerve root for 2 minutes and removed.  The area was irrigated. The tube system was then removed under microscopic visualization and hemostasis was obtained with a bipolar.  The fascial layer was reapproximated with the use of a 0- Vicryl suture.  Subcutaneous tissue layer was reapproximated using 2-0 Vicryl suture.  3-0 monocryl was used on the skin. The skin was then cleansed and Dermabond was used to close the skin opening.  Patient was then  rotated back to the preoperative bed awakened from anesthesia and taken to recovery all counts are correct in this case.   I performed the entire procedure with the assistance of Ivar DrapeAmanda Ferri PA as an Designer, television/film setassistant surgeon.  Venetia Nighthester Donal Lynam MD

## 2017-06-07 NOTE — OR Nursing (Signed)
AMBULATORY TO BR - VOIDED 300CC, BACK TO ROOM, TOLERATED WELL.

## 2017-06-07 NOTE — Transfer of Care (Signed)
Immediate Anesthesia Transfer of Care Note  Patient: Joshua Mann  Procedure(s) Performed: LUMBAR LAMINECTOMY/DECOMPRESSION MICRODISCECTOMY 1 LEVEL-L5-S1 (Right Spine Lumbar)  Patient Location: PACU  Anesthesia Type:General  Level of Consciousness: drowsy  Airway & Oxygen Therapy: Patient Spontanous Breathing and Patient connected to face mask oxygen  Post-op Assessment: Report given to RN and Post -op Vital signs reviewed and stable  Post vital signs: Reviewed and stable  Last Vitals:  Vitals:   06/07/17 1039 06/07/17 1336  BP: (!) 155/78 131/85  Pulse: 65 63  Resp: 17 11  Temp: (!) 36.3 C (!) 36.4 C  SpO2: 99% 100%    Last Pain:  Vitals:   06/07/17 1039  TempSrc: Temporal  PainSc: 8          Complications: No apparent anesthesia complications

## 2017-06-07 NOTE — Final Progress Note (Signed)
Patient seen in recovery s/p L5/S1 lumbar decompression for lumbar radiculopathy. He is still mildly groggy from anesthesia. Continues to have right posterior leg pain but feels as if it has improved since surgery. Also complains or persistent numbness at the lateral aspect of right foot and calf. Otherwise patient is doing well. Overall pain 5/10  PE:  Strength: 5/5 throughout Sensation: decreased right lateral foot and calf.  Skin: Incision site intact with glue present.   Plan: Continue pain control with oxycodone, robaxin,and tylenol on an as needed basis. Post op and wound care instructions reviewed with patient and family and they expressed understanding. All questions and concerns were addressed. Patient and family advised to contact office if additional questions or concerns arise. Patient is scheduled to follow up in clinic in 2 weeks to monitor progress.

## 2017-06-07 NOTE — Anesthesia Procedure Notes (Signed)
Procedure Name: Intubation Date/Time: 06/07/2017 12:07 PM Performed by: Eben Burow, CRNA Pre-anesthesia Checklist: Patient identified, Emergency Drugs available, Suction available, Patient being monitored and Timeout performed Patient Re-evaluated:Patient Re-evaluated prior to induction Oxygen Delivery Method: Circle system utilized Preoxygenation: Pre-oxygenation with 100% oxygen Induction Type: IV induction Ventilation: Mask ventilation without difficulty Laryngoscope Size: Miller and 2 Grade View: Grade I Tube type: Oral Tube size: 8.0 mm Number of attempts: 1 Airway Equipment and Method: Stylet and LTA kit utilized Placement Confirmation: ETT inserted through vocal cords under direct vision,  positive ETCO2 and breath sounds checked- equal and bilateral Secured at: 21 cm Tube secured with: Tape Dental Injury: Teeth and Oropharynx as per pre-operative assessment

## 2017-06-07 NOTE — H&P (Signed)
I have reviewed and confirmed my history and physical from 05/25/2017 with no additions or changes. Plan for R L5-S1 microdiscectomy.  Risks and benefits reviewed.  Heart sounds normal no MRG. Chest Clear to Auscultation Bilaterally.

## 2017-06-07 NOTE — Anesthesia Post-op Follow-up Note (Signed)
Anesthesia QCDR form completed.        

## 2017-06-08 ENCOUNTER — Encounter: Payer: Self-pay | Admitting: Neurosurgery

## 2017-06-08 NOTE — Anesthesia Postprocedure Evaluation (Signed)
Anesthesia Post Note  Patient: Joshua Mann  Procedure(s) Performed: LUMBAR LAMINECTOMY/DECOMPRESSION MICRODISCECTOMY 1 LEVEL-L5-S1 (Right Spine Lumbar)  Patient location during evaluation: PACU Anesthesia Type: General Level of consciousness: awake and alert Pain management: pain level controlled Vital Signs Assessment: post-procedure vital signs reviewed and stable Respiratory status: spontaneous breathing, nonlabored ventilation and respiratory function stable Cardiovascular status: blood pressure returned to baseline and stable Postop Assessment: no apparent nausea or vomiting Anesthetic complications: no     Last Vitals:  Vitals Value Taken Time  BP    Temp    Pulse    Resp    SpO2      Last Pain:  Vitals:   06/07/17 1613  TempSrc: Temporal  PainSc: 3                  Evagelia Knack Garry Heater Vennessa Affinito

## 2017-12-29 DIAGNOSIS — Z23 Encounter for immunization: Secondary | ICD-10-CM | POA: Diagnosis not present

## 2018-01-16 DIAGNOSIS — J029 Acute pharyngitis, unspecified: Secondary | ICD-10-CM | POA: Diagnosis not present

## 2018-01-16 DIAGNOSIS — J0140 Acute pansinusitis, unspecified: Secondary | ICD-10-CM | POA: Diagnosis not present

## 2018-02-08 DIAGNOSIS — Z1211 Encounter for screening for malignant neoplasm of colon: Secondary | ICD-10-CM | POA: Diagnosis not present

## 2018-02-08 DIAGNOSIS — Z800 Family history of malignant neoplasm of digestive organs: Secondary | ICD-10-CM | POA: Diagnosis not present

## 2018-02-08 DIAGNOSIS — Z01818 Encounter for other preprocedural examination: Secondary | ICD-10-CM | POA: Diagnosis not present

## 2018-02-12 DIAGNOSIS — D125 Benign neoplasm of sigmoid colon: Secondary | ICD-10-CM | POA: Diagnosis not present

## 2018-02-12 DIAGNOSIS — D122 Benign neoplasm of ascending colon: Secondary | ICD-10-CM | POA: Diagnosis not present

## 2018-02-12 DIAGNOSIS — K640 First degree hemorrhoids: Secondary | ICD-10-CM | POA: Diagnosis not present

## 2018-02-12 DIAGNOSIS — Z1211 Encounter for screening for malignant neoplasm of colon: Secondary | ICD-10-CM | POA: Diagnosis not present

## 2018-02-12 DIAGNOSIS — D123 Benign neoplasm of transverse colon: Secondary | ICD-10-CM | POA: Diagnosis not present

## 2018-02-12 DIAGNOSIS — Z800 Family history of malignant neoplasm of digestive organs: Secondary | ICD-10-CM | POA: Diagnosis not present

## 2018-04-30 ENCOUNTER — Other Ambulatory Visit: Payer: Self-pay | Admitting: Cardiovascular Disease

## 2018-06-10 ENCOUNTER — Other Ambulatory Visit: Payer: Self-pay | Admitting: Cardiovascular Disease

## 2018-06-11 NOTE — Telephone Encounter (Signed)
°*  STAT* If patient is at the pharmacy, call can be transferred to refill team.   1. Which medications need to be refilled? (please list name of each medication and dose if known) losartan 100 MG 1 tablet daily   2. Which pharmacy/location (including street and city if local pharmacy) is medication to be sent to? [facility] in [place]  3. Do they need a 30 day or 90 day supply? 90 day

## 2018-07-13 ENCOUNTER — Telehealth: Payer: Self-pay | Admitting: Cardiovascular Disease

## 2018-07-13 ENCOUNTER — Other Ambulatory Visit: Payer: Self-pay | Admitting: Cardiovascular Disease

## 2018-07-13 MED ORDER — LOSARTAN POTASSIUM 100 MG PO TABS: 100.0000 mg | ORAL_TABLET | Freq: Every day | ORAL | 0 refills | Status: DC

## 2018-07-13 MED ORDER — DOXAZOSIN MESYLATE 8 MG PO TABS: 8.0000 mg | ORAL_TABLET | Freq: Every day | ORAL | 0 refills | Status: DC

## 2018-07-13 NOTE — Telephone Encounter (Signed)
Virtual Visit Pre-Appointment Phone Call  "(Name), I am calling you today to discuss your upcoming appointment. We are currently trying to limit exposure to the virus that causes COVID-19 by seeing patients at home rather than in the office."  1. "What is the BEST phone number to call the day of the visit?" - include this in appointment notes  2. Do you have or have access to (through a family member/friend) a smartphone with video capability that we can use for your visit?" a. If yes - list this number in appt notes as cell (if different from BEST phone #) and list the appointment type as a VIDEO visit in appointment notes b. If no - list the appointment type as a PHONE visit in appointment notes  3. Confirm consent - "In the setting of the current Covid19 crisis, you are scheduled for a (phone or video) visit with your provider on (date) at (time).  Just as we do with many in-office visits, in order for you to participate in this visit, we must obtain consent.  If you'd like, I can send this to your mychart (if signed up) or email for you to review.  Otherwise, I can obtain your verbal consent now.  All virtual visits are billed to your insurance company just like a normal visit would be.  By agreeing to a virtual visit, we'd like you to understand that the technology does not allow for your provider to perform an examination, and thus may limit your provider's ability to fully assess your condition. If your provider identifies any concerns that need to be evaluated in person, we will make arrangements to do so.  Finally, though the technology is pretty good, we cannot assure that it will always work on either your or our end, and in the setting of a video visit, we may have to convert it to a phone-only visit.  In either situation, we cannot ensure that we have a secure connection.  Are you willing to proceed?" STAFF: Did the patient verbally acknowledge consent to telehealth visit? Document  YES/NO here: Yes   4. Advise patient to be prepared - "Two hours prior to your appointment, go ahead and check your blood pressure, pulse, oxygen saturation, and your weight (if you have the equipment to check those) and write them all down. When your visit starts, your provider will ask you for this information. If you have an Apple Watch or Kardia device, please plan to have heart rate information ready on the day of your appointment. Please have a pen and paper handy nearby the day of the visit as well."  5. Give patient instructions for MyChart download to smartphone OR Doximity/Doxy.me as below if video visit (depending on what platform provider is using)  6. Inform patient they will receive a phone call 15 minutes prior to their appointment time (may be from unknown caller ID) so they should be prepared to answer    TELEPHONE CALL NOTE  ARSHDEEP FRUCHEY has been deemed a candidate for a follow-up tele-health visit to limit community exposure during the Covid-19 pandemic. I spoke with the patient via phone to ensure availability of phone/video source, confirm preferred email & phone number, and discuss instructions and expectations.  I reminded Joshua Mann to be prepared with any vital sign and/or heart rhythm information that could potentially be obtained via home monitoring, at the time of his visit. I reminded Joshua Mann to expect a phone call prior  to his visit.  Norman Herrlichshley Gerringer 07/13/2018 10:29 AM   INSTRUCTIONS FOR DOWNLOADING THE MYCHART APP TO SMARTPHONE  - The patient must first make sure to have activated MyChart and know their login information - If Apple, go to [employer] Store and type in MyChart in the search bar and download the app. If Android, ask patient to go to [employer] Play Store and type in [place] in the search bar and download the app. The app is free but as with any other app downloads, their phone may require them to verify saved payment information  or Apple/Android password.  - The patient will need to then log into the app with their MyChart username and password, and select [facility] as their healthcare provider to link the account. When it is time for your visit, go to the MyChart app, find appointments, and click Begin Video Visit. Be sure to Select Allow for your device to access the Microphone and Camera for your visit. You will then be connected, and your provider will be with you shortly.  **If they have any issues connecting, or need assistance please contact MyChart service desk [phone])**  **If using a computer, in order to ensure the best quality for their visit they will need to use either of the following Internet Browsers: [employer] Edge, or Google Chrome**  IF USING DOXIMITY or DOXY.ME - The patient will receive a link just prior to their visit by text.     FULL LENGTH CONSENT FOR TELE-HEALTH VISIT   I hereby voluntarily request, consent and authorize [facility] and its employed or contracted physicians, physician assistants, nurse practitioners or other licensed health care professionals (the Practitioner), to provide me with telemedicine health care services (the Services") as deemed necessary by the treating Practitioner. I acknowledge and consent to receive the Services by the Practitioner via telemedicine. I understand that the telemedicine visit will involve communicating with the Practitioner through live audiovisual communication technology and the disclosure of certain medical information by electronic transmission. I acknowledge that I have been given the opportunity to request an in-person assessment or other available alternative prior to the telemedicine visit and am voluntarily participating in the telemedicine visit.  I understand that I have the right to withhold or withdraw my consent to the use of telemedicine in the course of my care at any time, without affecting my right to future  care or treatment, and that the Practitioner or I may terminate the telemedicine visit at any time. I understand that I have the right to inspect all information obtained and/or recorded in the course of the telemedicine visit and may receive copies of available information for a reasonable fee.  I understand that some of the potential risks of receiving the Services via telemedicine include:   Delay or interruption in medical evaluation due to technological equipment failure or disruption;  Information transmitted may not be sufficient (e.g. poor resolution of images) to allow for appropriate medical decision making by the Practitioner; and/or   In rare instances, security protocols could fail, causing a breach of personal health information.  Furthermore, I acknowledge that it is my responsibility to provide information about my medical history, conditions and care that is complete and accurate to the best of my ability. I acknowledge that Practitioner's advice, recommendations, and/or decision may be based on factors not within their control, such as incomplete or inaccurate data provided by me or distortions of diagnostic images or specimens that may result from electronic transmissions. I  understand that the practice of medicine is not an exact science and that Practitioner makes no warranties or guarantees regarding treatment outcomes. I acknowledge that I will receive a copy of this consent concurrently upon execution via email to the email address I last provided but may also request a printed copy by calling the office of [facility].    I understand that my insurance will be billed for this visit.   I have read or had this consent read to me.  I understand the contents of this consent, which adequately explains the benefits and risks of the Services being provided via telemedicine.   I have been provided ample opportunity to ask questions regarding this consent and the Services and have  had my questions answered to my satisfaction.  I give my informed consent for the services to be provided through the use of telemedicine in my medical care  By participating in this telemedicine visit I agree to the above.

## 2018-07-13 NOTE — Telephone Encounter (Signed)
Requested Prescriptions   Signed Prescriptions Disp Refills  . doxazosin (CARDURA) 8 MG tablet 30 tablet 0    Sig: Take 1 tablet (8 mg total) by mouth daily.    Authorizing Provider: Antonieta Iba    Ordering User: Thayer Headings, BRANDY L  . losartan (COZAAR) 100 MG tablet 30 tablet 0    Sig: Take 1 tablet (100 mg total) by mouth daily.    Authorizing Provider: Antonieta Iba    Ordering User: Thayer Headings, BRANDY L

## 2018-07-13 NOTE — Telephone Encounter (Signed)
°*  STAT* If patient is at the pharmacy, call can be transferred to refill team.   1. Which medications need to be refilled? (please list name of each medication and dose if known)  Doxazosin 8 MG 1 tablet daily Losartan 100 MG 1 tablet daily   2. Which pharmacy/location (including street and city if local pharmacy) is medication to be sent to? [facility] in [place]   3. Do they need a 30 day or 90 day supply? 90 day

## 2018-07-17 NOTE — Progress Notes (Signed)
Virtual Visit via Video Note   This visit type was conducted due to national recommendations for restrictions regarding the COVID-19 Pandemic (e.g. social distancing) in an effort to limit this patient's exposure and mitigate transmission in our community.  Due to his co-morbid illnesses, this patient is at least at moderate risk for complications without adequate follow up.  This format is felt to be most appropriate for this patient at this time.  All issues noted in this document were discussed and addressed.  A limited physical exam was performed with this format.  Please refer to the patient's chart for his consent to telehealth for [facility].   I connected with  Joshua Mann on 07/17/18 by a video enabled telemedicine application and verified that I am speaking with the correct person using two identifiers. I discussed the limitations of evaluation and management by telemedicine. The patient expressed understanding and agreed to proceed.   Evaluation Performed:  Follow-up visit  Date:  07/17/2018   ID:  Joshua Mann, DOB 08/30/1960, MRN [id number]  Patient Location:  [address] [place] [address]   Provider location:   [facility], [place] office  PCP:  Alvina Filbert, MD  Cardiologist:  Fonnie Mu   Chief Complaint:  BP elevated   History of Present Illness:    Joshua Mann is a 58 y.o. male who presents via audio/video conferencing for a telehealth visit today.   The patient does not symptoms concerning for COVID-19 infection (fever, chills, cough, or new SHORTNESS OF BREATH).   Patient has a past medical history of obstructive sleep apnea on CPAP,  PACs on Holter monitor Prior cholesterol more than 220  presents for followup of his hypertension and high cholesterol, bradycardia.  chronic cramping, little better This is a long-standing issue, etiology unclear Reports he previously held his lovastatin  with no significant improvement Cramping seems to happen later in the day, evening, overnight Reports he is hydrated  Blood pressure running consistently elevated typically 160 range systolic BP up, 163/85 Pulse 50s resp 16  no shortness of breath or chest pain on exertion Denies any palpitations  PMD checks labs  Previous lab work total cholesterol 188 on lovastatin 20 mg daily, LDL 81   Prior CV studies:   The following studies were reviewed today:  Other past medical history He wore a monitor from 06/22/12-06/28/12 which essentially showed infrequent PAC's.   Past Medical History:  Diagnosis Date  . Bruit 10/14/2009   Asymptomatic Bruit, Normal Carotid Doppler  . Chest pain 10/14/2009   Stress Test demontrated a small fixed Inferolateral perfusion defect  . Claudication (HCC) 10/14/2009   Normal Lower Arterial Doppler  . Hypertension    controlled  . Mixed dyslipidemia   . Murmur 10/31/2008   Echo EF >55%  AV apear mildly Sclerotic, No AS  . Obstructive sleep apnea    on c-pap   Past Surgical History:  Procedure Laterality Date  . HEEL SPUR EXCISION    . LUMBAR LAMINECTOMY/DECOMPRESSION MICRODISCECTOMY Right 06/07/2017   Procedure: LUMBAR LAMINECTOMY/DECOMPRESSION MICRODISCECTOMY 1 LEVEL-L5-S1;  Surgeon: Venetia Night, MD;  Location: [facility] ORS;  Service: Neurosurgery;  Laterality: Right;  . Ulcers  2002   Stomach Bleeding     No outpatient medications have been marked as taking for the 07/18/18 encounter (Appointment) with Antonieta Iba, MD.     Allergies:   Lisinopril   Social History   Tobacco Use  . Smoking  status: Former Smoker    Packs/day: 1.00    Years: 12.00    Pack years: 12.00    Types: Cigarettes    Last attempt to quit: 03/21/1982    Years since quitting: 36.3  . Smokeless tobacco: Never Used  Substance Use Topics  . Alcohol use: No  . Drug use: No     Current Outpatient Medications on File Prior to Visit  Medication Sig  Dispense Refill  . doxazosin (CARDURA) 8 MG tablet Take 1 tablet (8 mg total) by mouth daily. 30 tablet 0  . losartan (COZAAR) 100 MG tablet Take 1 tablet (100 mg total) by mouth daily. 30 tablet 0  . lovastatin (MEVACOR) 20 MG tablet Take 1 tablet (20 mg total) by mouth at bedtime. 90 tablet 3  . methocarbamol (ROBAXIN) 500 MG tablet Take 1 tablet (500 mg total) by mouth every 6 (six) hours as needed for muscle spasms. 90 tablet 0  . oxyCODONE (ROXICODONE) 5 MG immediate release tablet Take 1 tablet (5 mg total) by mouth every 4 (four) hours as needed for breakthrough pain. 30 tablet 0  . vitamin B-12 (CYANOCOBALAMIN) 100 MCG tablet Take 100 mcg by mouth daily as needed (energy).     No current facility-administered medications on file prior to visit.      Family Hx: The patient's family history includes Cancer (age of onset: 51) in his sister; Cancer (age of onset: 28) in his father; Hypertension in his mother.  ROS:   Please see the history of present illness.    Review of Systems  Constitutional: Negative.   Respiratory: Negative.   Cardiovascular: Negative.   Gastrointestinal: Negative.   Musculoskeletal: Negative.   Neurological: Negative.   Psychiatric/Behavioral: Negative.   All other systems reviewed and are negative.     Labs/Other Tests and Data Reviewed:    Recent Labs: No results found for requested labs within last 8760 hours.   Recent Lipid Panel Lab Results  Component Value Date/Time   CHOL 170 11/13/2015 10:14 AM   TRIG 285 (H) 11/13/2015 10:14 AM   HDL 33 (L) 11/13/2015 10:14 AM   CHOLHDL 5.2 (H) 11/13/2015 10:14 AM   LDLCALC 80 11/13/2015 10:14 AM    Wt Readings from Last 3 Encounters:  06/07/17 204 lb (92.5 kg)  06/02/17 204 lb (92.5 kg)  04/18/17 215 lb (97.5 kg)     Exam:    Vital Signs: Vital signs may also be detailed in the HPI There were no vitals taken for this visit.  Wt Readings from Last 3 Encounters:  06/07/17 204 lb (92.5 kg)   06/02/17 204 lb (92.5 kg)  04/18/17 215 lb (97.5 kg)   Temp Readings from Last 3 Encounters:  06/07/17 97.9 F (36.6 C) (Temporal)  04/18/17 (!) 97.4 F (36.3 C)  04/15/17 98 F (36.7 C) (Oral)   BP Readings from Last 3 Encounters:  06/07/17 133/78  06/02/17 129/77  05/16/17 (!) 168/97   Pulse Readings from Last 3 Encounters:  06/07/17 62  06/02/17 72  05/16/17 (!) 56    BP up, 163/85 Pulse 50s resp 16 Weight 210  Well nourished, well developed male in no acute distress. Constitutional:  oriented to person, place, and time. No distress.  Head: Normocephalic and atraumatic.  Eyes:  no discharge. No scleral icterus.  Neck: Normal range of motion. Neck supple.  Pulmonary/Chest: No audible wheezing, no distress, appears comfortable Musculoskeletal: Normal range of motion.  no  tenderness or deformity.  Neurological:  Coordination normal. Full exam not performed Skin:  No rash Psychiatric:  normal mood and affect. behavior is normal. Thought content normal.    ASSESSMENT & PLAN:    Essential hypertension Blood pressure consistently elevated Continue current medications, add amlodipine 5 mg daily Watch for leg swelling.  This may need to be increased up to 10 mg daily if pressure continues to run high  Mixed hyperlipidemia We have encouraged continued exercise, careful diet management in an effort to lose weight.  Stable Continue lovastatin  Bradycardia Asymptomatic, with beta-blockers  Chest tightness Denies any symptoms concerning for angina, for no further ischemic work-up at this time  OSA on CPAP Compliant with his CPAP  Palpitations Stable symptoms, no further work-up   COVID-19 Education: The signs and symptoms of COVID-19 were discussed with the patient and how to seek care for testing (follow up with PCP or arrange E-visit).  The importance of social distancing was discussed today.  Patient Risk:   After full review of this patients clinical  status, I feel that they are at least moderate risk at this time.  Time:   Today, I have spent 25 minutes with the patient with telehealth technology discussing the cardiac and medical problems/diagnoses detailed above   10 min spent reviewing the chart prior to patient visit today   Medication Adjustments/Labs and Tests Ordered: Current medicines are reviewed at length with the patient today.  Concerns regarding medicines are outlined above.   Tests Ordered: No tests ordered   Medication Changes: No changes made   Disposition: Follow-up in 6 months   Signed, Julien Nordmannimothy Gollan, MD  07/17/2018 8:53 AM    [facility] [place] Office [address], [place], [place] [address]

## 2018-07-18 ENCOUNTER — Telehealth (INDEPENDENT_AMBULATORY_CARE_PROVIDER_SITE_OTHER): Payer: BLUE CROSS/BLUE SHIELD | Admitting: Cardiovascular Disease

## 2018-07-18 ENCOUNTER — Other Ambulatory Visit: Payer: Self-pay

## 2018-07-18 DIAGNOSIS — R001 Bradycardia, unspecified: Secondary | ICD-10-CM

## 2018-07-18 DIAGNOSIS — R0789 Other chest pain: Secondary | ICD-10-CM | POA: Diagnosis not present

## 2018-07-18 DIAGNOSIS — Z9989 Dependence on other enabling machines and devices: Secondary | ICD-10-CM

## 2018-07-18 DIAGNOSIS — R002 Palpitations: Secondary | ICD-10-CM

## 2018-07-18 DIAGNOSIS — I10 Essential (primary) hypertension: Secondary | ICD-10-CM | POA: Diagnosis not present

## 2018-07-18 DIAGNOSIS — E782 Mixed hyperlipidemia: Secondary | ICD-10-CM | POA: Diagnosis not present

## 2018-07-18 DIAGNOSIS — G4733 Obstructive sleep apnea (adult) (pediatric): Secondary | ICD-10-CM

## 2018-07-18 MED ORDER — LOSARTAN POTASSIUM 100 MG PO TABS: 100.0000 mg | ORAL_TABLET | Freq: Every day | ORAL | 3 refills | Status: DC

## 2018-07-18 MED ORDER — DOXAZOSIN MESYLATE 8 MG PO TABS: 8.0000 mg | ORAL_TABLET | Freq: Every day | ORAL | 3 refills | Status: DC

## 2018-07-18 MED ORDER — LOVASTATIN 20 MG PO TABS: 20.0000 mg | ORAL_TABLET | Freq: Every day | ORAL | 3 refills | Status: DC

## 2018-07-18 MED ORDER — AMLODIPINE BESYLATE 5 MG PO TABS: 5.0000 mg | ORAL_TABLET | Freq: Every day | ORAL | 3 refills | Status: DC

## 2018-07-18 NOTE — Patient Instructions (Addendum)
Medication Instructions:  Your physician has recommended you make the following change in your medication:  1. START Amlodipine 5 mg once daily Refills sent in on your other medications as well.  If you need a refill on your cardiac medications before your next appointment, please call your pharmacy.    Lab work: Liver and lipids testing. Please call us to get this scheduled after virus. Make sure not to eat or drink anything after midnight before except small sip of water with your pills.   If you have labs (blood work) drawn today and your tests are completely normal, you will receive your results only by: Marland Kitchen MyChart Message (if you have MyChart) OR . A paper copy in the mail If you have any lab test that is abnormal or we need to change your treatment, we will call you to review the results.   Testing/Procedures: No testing at this time.   Follow-Up: At [facility], you and your health needs are our priority.  As part of our continuing mission to provide you with exceptional heart care, we have created designated Provider Care Teams.  These Care Teams include your primary Cardiologist (physician) and Advanced Practice Providers (APPs -  Physician Assistants and Nurse Practitioners) who all work together to provide you with the care you need, when you need it.  . You will need a follow up appointment in 6 months .   Please call our office 2 months in advance to schedule this appointment.    . Providers on your designated Care Team:   . Nicolasa Ducking, NP . Eula Listen, PA-C . Marisue Ivan, PA-C  Any Other Special Instructions Will Be Listed Below (If Applicable).  For educational health videos Log in to : [email or web address] Or : [email or web address], password : [email or web address]

## 2018-08-20 ENCOUNTER — Other Ambulatory Visit: Payer: Self-pay

## 2018-08-20 MED ORDER — LOSARTAN POTASSIUM 100 MG PO TABS: 100.0000 mg | ORAL_TABLET | Freq: Every day | ORAL | 0 refills | Status: DC

## 2018-08-20 NOTE — Telephone Encounter (Signed)
*  STAT* If patient is at the pharmacy, call can be transferred to refill team.   1. Which medications need to be refilled? (please list name of each medication and dose if known) Losartan  2. Which pharmacy/location (including street and city if local pharmacy) is medication to be sent to? [facility] [place] 3. Do they need a 30 day or 90 day supply? 90

## 2019-01-01 ENCOUNTER — Other Ambulatory Visit: Payer: Self-pay

## 2019-01-01 ENCOUNTER — Ambulatory Visit: Payer: Self-pay

## 2019-01-01 VITALS — BP 130/68 | HR 82 | Resp 16 | Ht 73.0 in | Wt 213.0 lb

## 2019-01-01 DIAGNOSIS — Z008 Encounter for other general examination: Secondary | ICD-10-CM

## 2019-01-01 DIAGNOSIS — Z23 Encounter for immunization: Secondary | ICD-10-CM | POA: Diagnosis not present

## 2019-01-01 LAB — POCT LIPID PANEL
HDL: 38
LDL: 72
Non-HDL: 134
POC Glucose: 104 mg/dl — AB (ref 70–99)
TC/HDL: 4.5
TC: 172
TRG: 309

## 2019-01-01 NOTE — Progress Notes (Signed)
     Patient ID: Joshua Mann, male    DOB: [birth year], 59 y.o.   MRN: [id number]    Thank you!!  Apolonio Schneiders RN  [facility] Nurse Specialist [place]: [phone]  Cell:  [phone] Website: [email or web address]

## 2019-01-01 NOTE — Patient Instructions (Signed)
Influenza (Flu) Vaccine (Inactivated or Recombinant): What You Need to Know 1. Why get vaccinated? Influenza vaccine can prevent influenza (flu). Flu is a contagious disease that spreads around the [place] every year, usually between October and May. Anyone can get the flu, but it is more dangerous for some people. Infants and young children, people 59 years of age and older, pregnant women, and people with certain health conditions or a weakened immune system are at greatest risk of flu complications. Pneumonia, bronchitis, sinus infections and ear infections are examples of flu-related complications. If you have a medical condition, such as heart disease, cancer or diabetes, flu can make it worse. Flu can cause fever and chills, sore throat, muscle aches, fatigue, cough, headache, and runny or stuffy nose. Some people may have vomiting and diarrhea, though this is more common in children than adults. Each year thousands of people in the [place] die from flu, and many more are hospitalized. Flu vaccine prevents millions of illnesses and flu-related visits to the doctor each year. 2. Influenza vaccine CDC recommends everyone 6 months of age and older get vaccinated every flu season. Children 6 months through 8 years of age may need 2 doses during a single flu season. Everyone else needs only 1 dose each flu season. It takes about 2 weeks for protection to develop after vaccination. There are many flu viruses, and they are always changing. Each year a new flu vaccine is made to protect against three or four viruses that are likely to cause disease in the upcoming flu season. Even when the vaccine doesn't exactly match these viruses, it may still provide some protection. Influenza vaccine does not cause flu. Influenza vaccine may be given at the same time as other vaccines. 3. Talk with your health care provider Tell your vaccine provider if the person getting the vaccine:  Has had an  allergic reaction after a previous dose of influenza vaccine, or has any severe, life-threatening allergies.  Has ever had Guillain-Barr Syndrome (also called GBS). In some cases, your health care provider may decide to postpone influenza vaccination to a future visit. People with minor illnesses, such as a cold, may be vaccinated. People who are moderately or severely ill should usually wait until they recover before getting influenza vaccine. Your health care provider can give you more information. 4. Risks of a vaccine reaction  Soreness, redness, and swelling where shot is given, fever, muscle aches, and headache can happen after influenza vaccine.  There may be a very small increased risk of Guillain-Barr Syndrome (GBS) after inactivated influenza vaccine (the flu shot). Young children who get the flu shot along with pneumococcal vaccine (PCV13), and/or DTaP vaccine at the same time might be slightly more likely to have a seizure caused by fever. Tell your health care provider if a child who is getting flu vaccine has ever had a seizure. People sometimes faint after medical procedures, including vaccination. Tell your provider if you feel dizzy or have vision changes or ringing in the ears. As with any medicine, there is a very remote chance of a vaccine causing a severe allergic reaction, other serious injury, or death. 5. What if there is a serious problem? An allergic reaction could occur after the vaccinated person leaves the clinic. If you see signs of a severe allergic reaction (hives, swelling of the face and throat, difficulty breathing, a fast heartbeat, dizziness, or weakness), call 9-1-1 and get the person to the nearest hospital. For other signs that   concern you, call your health care provider. Adverse reactions should be reported to the Vaccine Adverse Event Reporting System (VAERS). Your health care provider will usually file this report, or you can do it yourself. Visit the  VAERS website at www.vaers.hhs.gov or call 1-800-822-7967.VAERS is only for reporting reactions, and VAERS staff do not give medical advice. 6. The National Vaccine Injury Compensation Program The National Vaccine Injury Compensation Program (VICP) is a federal program that was created to compensate people who may have been injured by certain vaccines. Visit the VICP website at www.hrsa.gov/vaccinecompensation or call 1-800-338-2382 to learn about the program and about filing a claim. There is a time limit to file a claim for compensation. 7. How can I learn more?  Ask your healthcare provider.  Call your local or state health department.  Contact the Centers for Disease Control and Prevention (CDC): ? Call 1-800-232-4636 (1-800-CDC-INFO) or ? Visit CDC's www.cdc.gov/flu Vaccine Information Statement (Interim) Inactivated Influenza Vaccine (11/02/2017) This information is not intended to replace advice given to you by your health care provider. Make sure you discuss any questions you have with your health care provider. Document Released: 12/30/2005 Document Revised: 06/26/2018 Document Reviewed: 11/06/2017 Elsevier Patient Education  2020 Elsevier Inc. Preventing Influenza, Adult Influenza, more commonly known as "the flu," is a viral infection that mainly affects the respiratory tract. The respiratory tract includes structures that help you breathe, such as the lungs, nose, and throat. The flu causes many common cold symptoms, as well as a high fever and body aches. The flu spreads easily from person to person (is contagious). The flu is most common from December through March. This is called flu season.You can catch the flu virus by:  Breathing in droplets from an infected person's cough or sneeze.  Touching something that was recently contaminated with the virus and then touching your mouth, nose, or eyes. What can I do to lower my risk?        You can decrease your risk of getting  the flu by:  Getting a flu shot (influenza vaccination) every year. This is the best way to prevent the flu. A flu shot is recommended for everyone age 6 months and older. ? It is best to get a flu shot in the fall, as soon as it is available. Getting a flu shot during winter or spring instead is still a good idea. Flu season can last into early spring. ? Preventing the flu through vaccination requires getting a new flu shot every year. This is because the flu virus changes slightly (mutates) from one year to the next. Even if a flu shot does not completely protect you from all flu virus mutations, it can reduce the severity of your illness and prevent dangerous complications of the flu. ? If you are pregnant, you can and should get a flu shot. ? If you have had a reaction to the shot in the past or if you are allergic to eggs, check with your health care provider before getting a flu shot. ? Sometimes the vaccine is available as a nasal spray. In some years, the nasal spray has not been as effective against the flu virus. Check with your health care provider if you have questions about this.  Practicing good health habits. This is especially important during flu season. ? Avoid contact with people who are sick with flu or cold symptoms. ? Wash your hands with soap and water often. If soap and water are not available, use alcohol-based   hand sanitizer. ? Avoid touching your hands to your face, especially when you have not washed your hands recently. ? Use a disinfectant to clean surfaces at home and at work that may be contaminated with the flu virus. ? Keep your body's disease-fighting system (immune system) in good shape by eating a healthy diet, drinking plenty of fluids, getting enough sleep, and exercising regularly. If you do get the flu, avoid spreading it to others by:  Staying home until your symptoms have been gone for at least one day.  Covering your mouth and nose when you cough or  sneeze.  Avoiding close contact with others, especially babies and elderly people. Why are these changes important? Getting a flu shot and practicing good health habits protects you as well as other people. If you get the flu, your friends, family, and co-workers are also at risk of getting it, because it spreads so easily to others. Each year, about 2 out of every 10 people get the flu. Having the flu can lead to complications, such as pneumonia, ear infection, and sinus infection. The flu also can be deadly, especially for babies, people older than age 59, and people who have serious long-term diseases. How is this treated? Most people recover from the flu by resting at home and drinking plenty of fluids. However, a prescription antiviral medicine may reduce your flu symptoms and may make your flu go away sooner. This medicine must be started within a few days of getting flu symptoms. You can talk with your health care provider about whether you need an antiviral medicine. Antiviral medicine may be prescribed for people who are at risk for more serious flu symptoms. This includes people who:  Are older than age 59.  Are pregnant.  Have a condition that makes the flu worse or more dangerous. Where to find more information  Centers for Disease Control and Prevention: www.cdc.gov/flu/index.htm  Flu.gov: www.flu.gov/prevention-vaccination  American Academy of Family Physicians: familydoctor.org/familydoctor/en/kids/vaccines/preventing-the-flu.html Contact a health care provider if:  You have influenza and you develop new symptoms.  You have: ? Chest pain. ? Diarrhea. ? A fever.  Your cough gets worse, or you produce more mucus. Summary  The best way to prevent the flu is to get a flu shot every year in the fall.  Even if you get the flu after you have received the yearly vaccine, your flu may be milder and go away sooner because of your flu shot.  If you get the flu, antiviral  medicines that are started with a few days of symptoms may reduce your flu symptoms and may make your flu go away sooner.  You can also help prevent the flu by practicing good health habits. This information is not intended to replace advice given to you by your health care provider. Make sure you discuss any questions you have with your health care provider. Document Released: 03/22/2015 Document Revised: 02/17/2017 Document Reviewed: 11/14/2015 Elsevier Patient Education  2020 Elsevier Inc.  

## 2019-01-21 NOTE — Progress Notes (Signed)
Date:  01/22/2019   ID:  Joshua Mann, DOB 1960-04-09, MRN [id number]  Patient Location:  [address] [place] [address]   Provider location:   [facility], [place] office  PCP:  Alvina Filbert, MD  Cardiologist:  Hubbard Robinson [facility]   Chief Complaint  Patient presents with  . other    6 month foloow up. patient denies chest pain and SOB at this time. Meds reviewed verbally with patient.     History of Present Illness:    Joshua Mann is a 58 y.o. male  past medical history of obstructive sleep apnea on CPAP,  PACs on Holter monitor Prior cholesterol more than 220 Chronic leg cramping unrelated to statin  presents for followup of his hypertension and high cholesterol, bradycardia.  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   Past Medical History:  Diagnosis Date  . Bruit 10/14/2009   Asymptomatic Bruit, Normal Carotid Doppler  . Chest pain 10/14/2009   Stress Test demontrated a small fixed Inferolateral perfusion defect  . Claudication (HCC) 10/14/2009   Normal Lower Arterial Doppler  . Hypertension    controlled  . Mixed dyslipidemia   . Murmur 10/31/2008   Echo EF >55%  AV apear mildly Sclerotic, No AS  . Obstructive sleep apnea    on  c-pap   Past Surgical History:  Procedure Laterality Date  . HEEL SPUR EXCISION    . LUMBAR LAMINECTOMY/DECOMPRESSION MICRODISCECTOMY Right 06/07/2017   Procedure: LUMBAR LAMINECTOMY/DECOMPRESSION MICRODISCECTOMY 1 LEVEL-L5-S1;  Surgeon: Venetia Night, MD;  Location: [facility] ORS;  Service: Neurosurgery;  Laterality: Right;  . Ulcers  2002   Stomach Bleeding     Current Meds  Medication Sig  . doxazosin (CARDURA) 8 MG tablet Take 1 tablet (8 mg total) by mouth daily.  Marland Kitchen losartan (COZAAR) 100 MG tablet Take 1 tablet (100 mg total) by mouth daily.  Marland Kitchen lovastatin (MEVACOR) 20 MG tablet Take 1 tablet (20 mg total) by mouth at bedtime.     Allergies:   Lisinopril   Social History   Tobacco Use  . Smoking status: Former Smoker    Packs/day: 1.00    Years: 12.00    Pack years: 12.00    Types: Cigarettes    Quit date: 03/21/1982    Years since quitting: 36.8  . Smokeless tobacco: Never Used  Substance Use Topics  . Alcohol use: No  . Drug use: No     Current Outpatient Medications on File Prior to Visit  Medication Sig Dispense Refill  . doxazosin (CARDURA) 8 MG tablet Take 1 tablet (8 mg total) by mouth daily. 90 tablet 3  . losartan (COZAAR) 100 MG tablet Take 1 tablet (100 mg total) by mouth daily. 90 tablet 0  . lovastatin (MEVACOR) 20  MG tablet Take 1 tablet (20 mg total) by mouth at bedtime. 90 tablet 3  . amLODipine (NORVASC) 5 MG tablet Take 1 tablet (5 mg total) by mouth daily. 180 tablet 3   No current facility-administered medications on file prior to visit.      Family Hx: The patient's family history includes Cancer (age of onset: 109) in his sister; Cancer (age of onset: 103) in his father; Hypertension in his mother.  ROS:   Please see the history of present illness.    Review of Systems  Constitutional: Negative.   Respiratory: Negative.   Cardiovascular: Negative.   Gastrointestinal: Negative.   Musculoskeletal: Negative.        Arthritic pain in the hands  Cramps in the legs  Neurological: Negative.   Psychiatric/Behavioral: Negative.   All other systems reviewed and are negative.     Labs/Other Tests and Data Reviewed:    Recent Labs: No results found for requested labs within last 8760 hours.   Recent Lipid Panel Lab Results  Component Value Date/Time   CHOL 170 11/13/2015 10:14 AM   TRIG 285 (H) 11/13/2015 10:14 AM   HDL 33 (L) 11/13/2015 10:14 AM   CHOLHDL 5.2 (H) 11/13/2015 10:14 AM   LDLCALC 80 11/13/2015 10:14 AM    Wt Readings from Last 3 Encounters:  01/22/19 210 lb (95.3 kg)  01/01/19 213 lb (96.6 kg)  06/07/17 204 lb (92.5 kg)     Exam:    Vital Signs: Vital signs may also be detailed in the HPI BP 134/80 (BP Location: Left Arm, Patient Position: Sitting, Cuff Size: Normal)   Pulse (!) 59   Ht 6\' 1"  (1.854 m)   Wt 210 lb (95.3 kg)   BMI 27.71 kg/m   Wt Readings from Last 3 Encounters:  01/22/19 210 lb (95.3 kg)  01/01/19 213 lb (96.6 kg)  06/07/17 204 lb (92.5 kg)   Temp Readings from Last 3 Encounters:  06/07/17 97.9 F (36.6 C) (Temporal)  04/18/17 (!) 97.4 F (36.3 C)  04/15/17 98 F (36.7 C) (Oral)   BP Readings from Last 3 Encounters:  01/22/19 134/80  01/01/19 130/68  06/07/17 133/78   Pulse Readings from Last 3 Encounters:  01/22/19 (!) 59  01/01/19 82  06/07/17 62     Constitutional:  oriented to person, place, and time. No distress.  HENT:  Head: Grossly normal Eyes:  no discharge. No scleral icterus.  Neck: No JVD, no carotid bruits  Cardiovascular: Regular rate and rhythm, no murmurs appreciated Pulmonary/Chest: Clear to auscultation bilaterally, no wheezes or rails Abdominal: Soft.  no distension.  no tenderness.  Musculoskeletal: Normal range of motion Neurological:  normal muscle tone. Coordination normal. No atrophy Skin: Skin warm and dry Psychiatric: normal affect, pleasant  ASSESSMENT & PLAN:    Essential hypertension Reports blood pressure well controlled at  home Recommend he continue to check pressures, if it runs low he may be able to cut back on the amlodipine  Mixed hyperlipidemia Cholesterol is at goal on the current lipid regimen. No changes to the medications were made.  Bradycardia Asymptomatic  Chest tightness No recent symptoms, active at baseline Denies angina  OSA on CPAP Compliant with his CPAP Stable  Palpitations Stable symptoms, no further work-up No further episodes   Disposition: Follow-up in 12 months   Signed, Ida Rogue, MD  01/22/2019 3:32 PM    [place] [facility] [address], [place], [place] [address]

## 2019-01-22 ENCOUNTER — Other Ambulatory Visit: Payer: Self-pay

## 2019-01-22 ENCOUNTER — Encounter: Payer: Self-pay | Admitting: Cardiovascular Disease

## 2019-01-22 ENCOUNTER — Ambulatory Visit (INDEPENDENT_AMBULATORY_CARE_PROVIDER_SITE_OTHER): Payer: BC Managed Care – PPO | Admitting: Cardiovascular Disease

## 2019-01-22 VITALS — BP 134/80 | HR 59 | Ht 73.0 in | Wt 210.0 lb

## 2019-01-22 DIAGNOSIS — E782 Mixed hyperlipidemia: Secondary | ICD-10-CM

## 2019-01-22 DIAGNOSIS — Z9989 Dependence on other enabling machines and devices: Secondary | ICD-10-CM

## 2019-01-22 DIAGNOSIS — R0789 Other chest pain: Secondary | ICD-10-CM | POA: Diagnosis not present

## 2019-01-22 DIAGNOSIS — R001 Bradycardia, unspecified: Secondary | ICD-10-CM | POA: Diagnosis not present

## 2019-01-22 DIAGNOSIS — I10 Essential (primary) hypertension: Secondary | ICD-10-CM | POA: Diagnosis not present

## 2019-01-22 DIAGNOSIS — G4733 Obstructive sleep apnea (adult) (pediatric): Secondary | ICD-10-CM

## 2019-01-22 NOTE — Patient Instructions (Signed)

## 2019-04-02 DIAGNOSIS — I10 Essential (primary) hypertension: Secondary | ICD-10-CM | POA: Diagnosis not present

## 2019-04-02 DIAGNOSIS — M25541 Pain in joints of right hand: Secondary | ICD-10-CM | POA: Diagnosis not present

## 2019-05-11 ENCOUNTER — Ambulatory Visit: Payer: BC Managed Care – PPO | Attending: Internal Medicine

## 2019-05-11 DIAGNOSIS — Z23 Encounter for immunization: Secondary | ICD-10-CM | POA: Insufficient documentation

## 2019-05-11 NOTE — Progress Notes (Signed)
   Covid-19 Vaccination Clinic  Name:  Joshua Mann    MRN: [id number] DOB: Jan 04, 1961  05/11/2019  Mr. Joshua Mann was observed post Covid-19 immunization for 15 minutes without incidence. He was provided with Vaccine Information Sheet and instruction to access the V-Safe system.   Mr. Joshua Mann was instructed to call 911 with any severe reactions post vaccine: Marland Kitchen Difficulty breathing  . Swelling of your face and throat  . A fast heartbeat  . A bad rash all over your body  . Dizziness and weakness    Immunizations Administered    Name Date Dose VIS Date Route   Pfizer COVID-19 Vaccine 05/11/2019  3:51 PM 0.3 mL 03/01/2019 Intramuscular   Manufacturer: [employer], [employer]   Lot: [id number]   NDC: [address]-9

## 2019-06-04 ENCOUNTER — Ambulatory Visit: Payer: BC Managed Care – PPO | Attending: Internal Medicine

## 2019-06-04 DIAGNOSIS — Z23 Encounter for immunization: Secondary | ICD-10-CM

## 2019-06-04 NOTE — Progress Notes (Signed)
   Covid-19 Vaccination Clinic  Name:  BERLIE HATCHEL    MRN: [id number] DOB: Jul 29, 1960  06/04/2019  Mr. Prevo was observed post Covid-19 immunization for 15 minutes without incident. He was provided with Vaccine Information Sheet and instruction to access the V-Safe system.   Mr. Grine was instructed to call 911 with any severe reactions post vaccine: Marland Kitchen Difficulty breathing  . Swelling of face and throat  . A fast heartbeat  . A bad rash all over body  . Dizziness and weakness   Immunizations Administered    Name Date Dose VIS Date Route   Pfizer COVID-19 Vaccine 06/04/2019  3:47 PM 0.3 mL 03/01/2019 Intramuscular   Manufacturer: [employer], [employer]   Lot: [id number]   NDC: [address]-1

## 2019-06-18 DIAGNOSIS — M25461 Effusion, right knee: Secondary | ICD-10-CM | POA: Diagnosis not present

## 2019-06-18 DIAGNOSIS — M25562 Pain in left knee: Secondary | ICD-10-CM | POA: Diagnosis not present

## 2019-06-18 DIAGNOSIS — M25561 Pain in right knee: Secondary | ICD-10-CM | POA: Diagnosis not present

## 2019-06-18 DIAGNOSIS — M170 Bilateral primary osteoarthritis of knee: Secondary | ICD-10-CM | POA: Diagnosis not present

## 2019-06-18 DIAGNOSIS — G8929 Other chronic pain: Secondary | ICD-10-CM | POA: Diagnosis not present

## 2019-10-16 ENCOUNTER — Other Ambulatory Visit: Payer: Self-pay | Admitting: Cardiovascular Disease

## 2019-10-22 ENCOUNTER — Ambulatory Visit (INDEPENDENT_AMBULATORY_CARE_PROVIDER_SITE_OTHER): Payer: BC Managed Care – PPO

## 2019-10-22 ENCOUNTER — Other Ambulatory Visit: Payer: Self-pay | Admitting: Podiatry

## 2019-10-22 ENCOUNTER — Other Ambulatory Visit: Payer: Self-pay

## 2019-10-22 ENCOUNTER — Ambulatory Visit: Payer: BC Managed Care – PPO | Admitting: Podiatry

## 2019-10-22 DIAGNOSIS — M79671 Pain in right foot: Secondary | ICD-10-CM

## 2019-10-22 DIAGNOSIS — M7661 Achilles tendinitis, right leg: Secondary | ICD-10-CM

## 2019-10-22 DIAGNOSIS — M722 Plantar fascial fibromatosis: Secondary | ICD-10-CM | POA: Diagnosis not present

## 2019-10-23 ENCOUNTER — Encounter: Payer: Self-pay | Admitting: Podiatry

## 2019-10-23 NOTE — Progress Notes (Signed)
Subjective:  Patient ID: Joshua Mann, male    DOB: 02/27/1961,  MRN: [id number]  Chief Complaint  Patient presents with  . Foot Pain    pt is here for right heel pain, pt has a possible spur    59 y.o. male presents with the above complaint.  Patient presents with complaint of right heel pain that has been going on for about 1 to 2 months patient states it progressively got worse.  Patient states that his bottom and back of the heel.  Patient states is worse in the morning when getting up.  Patient has had history of gastrocnemius surgery that was done couple years ago.  Patient states that there is some aching sensation associated with it.  He denies any other acute complaints.  He has not tried any stretching exercises or any other conservative treatment options.  He would like to discuss various treatment options.  He has not seen anyone else prior to seeing me for this.   Review of Systems: Negative except as noted in the HPI. Denies N/V/F/Ch.  Past Medical History:  Diagnosis Date  . Bruit 10/14/2009   Asymptomatic Bruit, Normal Carotid Doppler  . Chest pain 10/14/2009   Stress Test demontrated a small fixed Inferolateral perfusion defect  . Claudication (HCC) 10/14/2009   Normal Lower Arterial Doppler  . Hypertension    controlled  . Mixed dyslipidemia   . Murmur 10/31/2008   Echo EF >55%  AV apear mildly Sclerotic, No AS  . Obstructive sleep apnea    on c-pap    Current Outpatient Medications:  .  doxazosin (CARDURA) 8 MG tablet, Take 1 tablet by mouth once daily, Disp: 90 tablet, Rfl: 0 .  losartan (COZAAR) 100 MG tablet, Take 1 tablet (100 mg total) by mouth daily., Disp: 90 tablet, Rfl: 0 .  lovastatin (MEVACOR) 20 MG tablet, Take 1 tablet (20 mg total) by mouth at bedtime., Disp: 90 tablet, Rfl: 3 .  amLODipine (NORVASC) 5 MG tablet, Take 1 tablet (5 mg total) by mouth daily., Disp: 180 tablet, Rfl: 3  Social History   Tobacco Use  Smoking Status Former  Smoker  . Packs/day: 1.00  . Years: 12.00  . Pack years: 12.00  . Types: Cigarettes  . Quit date: 03/21/1982  . Years since quitting: 37.6  Smokeless Tobacco Never Used    Allergies  Allergen Reactions  . Lisinopril Cough   Objective:  There were no vitals filed for this visit. There is no height or weight on file to calculate BMI. Constitutional Well developed. Well nourished.  Vascular Dorsalis pedis pulses palpable bilaterally. Posterior tibial pulses palpable bilaterally. Capillary refill normal to all digits.  No cyanosis or clubbing noted. Pedal hair growth normal.  Neurologic Normal speech. Oriented to person, place, and time. Epicritic sensation to light touch grossly present bilaterally.  Dermatologic Nails well groomed and normal in appearance. No open wounds. No skin lesions.  Orthopedic: Normal joint ROM without pain or crepitus bilaterally. No visible deformities. Tender to palpation at the calcaneal tuber right. No pain with calcaneal squeeze right. Ankle ROM diminished range of motion right. Silfverskiold Test: positive right.   Radiographs: Taken and reviewed. No acute fractures or dislocations. No evidence of stress fracture.  Plantar heel spur present. Posterior heel spur present.   Assessment:   1. Foot pain, right   2. Plantar fasciitis of right foot    Plan:  Patient was evaluated and treated and all questions answered.  Plantar Fasciitis,  right - XR reviewed as above.  - Educated on icing and stretching. Instructions given.  - Injection delivered to the plantar fascia as below. - DME: Plantar Fascial Brace - Pharmacologic management: None educated on risks/benefits and proper taking of medication.  Procedure: Injection Tendon/Ligament Location: Right plantar fascia at the glabrous junction; medial approach. Skin Prep: alcohol Injectate: 0.5 cc 0.5% marcaine plain, 0.5 cc of 1% Lidocaine, 0.5 cc kenalog 10. Disposition: Patient tolerated  procedure well. Injection site dressed with a band-aid.  No follow-ups on file.

## 2019-11-19 ENCOUNTER — Other Ambulatory Visit: Payer: Self-pay

## 2019-11-19 ENCOUNTER — Ambulatory Visit: Payer: BC Managed Care – PPO | Admitting: Podiatry

## 2019-11-19 ENCOUNTER — Encounter: Payer: Self-pay | Admitting: Podiatry

## 2019-11-19 DIAGNOSIS — Q666 Other congenital valgus deformities of feet: Secondary | ICD-10-CM

## 2019-11-19 DIAGNOSIS — M722 Plantar fascial fibromatosis: Secondary | ICD-10-CM

## 2019-11-19 DIAGNOSIS — M79671 Pain in right foot: Secondary | ICD-10-CM

## 2019-11-20 ENCOUNTER — Encounter: Payer: Self-pay | Admitting: Podiatry

## 2019-11-20 NOTE — Progress Notes (Signed)
Subjective:  Patient ID: Joshua Mann, male    DOB: Oct 30, 1960,  MRN: [id number]  Chief Complaint  Patient presents with  . Foot Pain    59 y.o. male presents with the above complaint.  Patient presents with a follow-up of right plantar fasciitis.  Patient states injection did not help the bracing helped a little bit but not much.  He states that he would like to discuss other treatment options.  He has been on his feet a lot at work.  He denies any other acute complaints.   Review of Systems: Negative except as noted in the HPI. Denies N/V/F/Ch.  Past Medical History:  Diagnosis Date  . Bruit 10/14/2009   Asymptomatic Bruit, Normal Carotid Doppler  . Chest pain 10/14/2009   Stress Test demontrated a small fixed Inferolateral perfusion defect  . Claudication (HCC) 10/14/2009   Normal Lower Arterial Doppler  . Hypertension    controlled  . Mixed dyslipidemia   . Murmur 10/31/2008   Echo EF >55%  AV apear mildly Sclerotic, No AS  . Obstructive sleep apnea    on c-pap    Current Outpatient Medications:  .  amLODipine (NORVASC) 5 MG tablet, Take 1 tablet (5 mg total) by mouth daily., Disp: 180 tablet, Rfl: 3 .  doxazosin (CARDURA) 8 MG tablet, Take 1 tablet by mouth once daily, Disp: 90 tablet, Rfl: 0 .  losartan (COZAAR) 100 MG tablet, Take 1 tablet (100 mg total) by mouth daily., Disp: 90 tablet, Rfl: 0 .  lovastatin (MEVACOR) 20 MG tablet, Take 1 tablet (20 mg total) by mouth at bedtime., Disp: 90 tablet, Rfl: 3  Social History   Tobacco Use  Smoking Status Former Smoker  . Packs/day: 1.00  . Years: 12.00  . Pack years: 12.00  . Types: Cigarettes  . Quit date: 03/21/1982  . Years since quitting: 37.6  Smokeless Tobacco Never Used    Allergies  Allergen Reactions  . Lisinopril Cough   Objective:  There were no vitals filed for this visit. There is no height or weight on file to calculate BMI. Constitutional Well developed. Well nourished.  Vascular  Dorsalis pedis pulses palpable bilaterally. Posterior tibial pulses palpable bilaterally. Capillary refill normal to all digits.  No cyanosis or clubbing noted. Pedal hair growth normal.  Neurologic Normal speech. Oriented to person, place, and time. Epicritic sensation to light touch grossly present bilaterally.  Dermatologic Nails well groomed and normal in appearance. No open wounds. No skin lesions.  Orthopedic: Normal joint ROM without pain or crepitus bilaterally. No visible deformities. Tender to palpation at the calcaneal tuber right. No pain with calcaneal squeeze right. Ankle ROM diminished range of motion right. Silfverskiold Test: positive right.   Radiographs: Taken and reviewed. No acute fractures or dislocations. No evidence of stress fracture.  Plantar heel spur present. Posterior heel spur present.   Assessment:   1. Plantar fasciitis of right foot   2. Foot pain, right   3. Pes planovalgus    Plan:  Patient was evaluated and treated and all questions answered.  Plantar Fasciitis, right - XR reviewed as above.  - Educated on icing and stretching. Instructions given.  -I will hold off on another set of injections as a previous injection not help at all. - DME: Cam boot - Pharmacologic management: None educated on risks/benefits and proper taking of medication.  a band-aid.   Pes planovalgus -I explained patient the etiology of pes planovalgus and various treatment options were discussed especially  with his history of plantar fasciitis.  Given that he is constantly on his foot and work boots he needs good supportive sneakers as well as orthotics.  I discussed this with the patient.  He will he will benefit from custom-made orthotics for his foot. -He will be scheduled see rec for custom-made orthotics for pes planovalgus as well as plantar fasciitis.  No follow-ups on file.

## 2019-11-26 DIAGNOSIS — M722 Plantar fascial fibromatosis: Secondary | ICD-10-CM | POA: Diagnosis not present

## 2019-12-04 ENCOUNTER — Other Ambulatory Visit: Payer: Self-pay

## 2019-12-04 ENCOUNTER — Ambulatory Visit (INDEPENDENT_AMBULATORY_CARE_PROVIDER_SITE_OTHER): Payer: BC Managed Care – PPO | Admitting: Orthotics

## 2019-12-04 DIAGNOSIS — Q666 Other congenital valgus deformities of feet: Secondary | ICD-10-CM

## 2019-12-04 DIAGNOSIS — M79671 Pain in right foot: Secondary | ICD-10-CM

## 2019-12-04 DIAGNOSIS — M722 Plantar fascial fibromatosis: Secondary | ICD-10-CM

## 2019-12-17 ENCOUNTER — Ambulatory Visit: Payer: BC Managed Care – PPO | Admitting: Podiatry

## 2019-12-17 ENCOUNTER — Encounter: Payer: Self-pay | Admitting: Podiatry

## 2019-12-17 ENCOUNTER — Other Ambulatory Visit: Payer: Self-pay

## 2019-12-17 DIAGNOSIS — M722 Plantar fascial fibromatosis: Secondary | ICD-10-CM

## 2019-12-17 DIAGNOSIS — M79671 Pain in right foot: Secondary | ICD-10-CM | POA: Diagnosis not present

## 2019-12-17 MED ORDER — METHYLPREDNISOLONE 4 MG PO TABS: ORAL_TABLET | ORAL | 0 refills | Status: DC

## 2019-12-17 MED ORDER — MELOXICAM 15 MG PO TABS: 15.0000 mg | ORAL_TABLET | Freq: Every day | ORAL | 0 refills | Status: DC

## 2019-12-18 ENCOUNTER — Encounter: Payer: Self-pay | Admitting: Podiatry

## 2019-12-18 NOTE — Progress Notes (Signed)
Subjective:  Patient ID: Joshua Mann, male    DOB: 06-06-1960,  MRN: [id number]  Chief Complaint  Patient presents with  . Plantar Fasciitis    "it was doing better the first 2 weeks, then the pain came back and is no better than before"    59 y.o. male presents with the above complaint.  Patient presents with a follow-up of right plantar fasciitis.  Patient states he still continues to hurt.  The injection has not helped the cam boot immobilization has not helped.  He has been on his foot a lot.  He denies any other acute complaints.  She scheduled get his orthotics made soon then pick it up.  He has not tried those before.  He would like to discuss any treatment options including surgical at this time   Review of Systems: Negative except as noted in the HPI. Denies N/V/F/Ch.  Past Medical History:  Diagnosis Date  . Bruit 10/14/2009   Asymptomatic Bruit, Normal Carotid Doppler  . Chest pain 10/14/2009   Stress Test demontrated a small fixed Inferolateral perfusion defect  . Claudication (HCC) 10/14/2009   Normal Lower Arterial Doppler  . Hypertension    controlled  . Mixed dyslipidemia   . Murmur 10/31/2008   Echo EF >55%  AV apear mildly Sclerotic, No AS  . Obstructive sleep apnea    on c-pap    Current Outpatient Medications:  .  amLODipine (NORVASC) 5 MG tablet, Take 1 tablet (5 mg total) by mouth daily., Disp: 180 tablet, Rfl: 3 .  doxazosin (CARDURA) 8 MG tablet, Take 1 tablet by mouth once daily, Disp: 90 tablet, Rfl: 0 .  losartan (COZAAR) 100 MG tablet, Take 1 tablet (100 mg total) by mouth daily., Disp: 90 tablet, Rfl: 0 .  lovastatin (MEVACOR) 20 MG tablet, Take 1 tablet (20 mg total) by mouth at bedtime., Disp: 90 tablet, Rfl: 3 .  meloxicam (MOBIC) 15 MG tablet, Take 1 tablet (15 mg total) by mouth daily., Disp: 30 tablet, Rfl: 0 .  methylPREDNISolone (MEDROL) 4 MG tablet, Take as directed, Disp: 21 tablet, Rfl: 0  Social History   Tobacco Use  Smoking  Status Former Smoker  . Packs/day: 1.00  . Years: 12.00  . Pack years: 12.00  . Types: Cigarettes  . Quit date: 03/21/1982  . Years since quitting: 37.7  Smokeless Tobacco Never Used    Allergies  Allergen Reactions  . Lisinopril Cough   Objective:  There were no vitals filed for this visit. There is no height or weight on file to calculate BMI. Constitutional Well developed. Well nourished.  Vascular Dorsalis pedis pulses palpable bilaterally. Posterior tibial pulses palpable bilaterally. Capillary refill normal to all digits.  No cyanosis or clubbing noted. Pedal hair growth normal.  Neurologic Normal speech. Oriented to person, place, and time. Epicritic sensation to light touch grossly present bilaterally.  Dermatologic Nails well groomed and normal in appearance. No open wounds. No skin lesions.  Orthopedic: Normal joint ROM without pain or crepitus bilaterally. No visible deformities. Tender to palpation at the calcaneal tuber right. No pain with calcaneal squeeze right. Ankle ROM diminished range of motion right. Silfverskiold Test: positive right.   Radiographs: Taken and reviewed. No acute fractures or dislocations. No evidence of stress fracture.  Plantar heel spur present. Posterior heel spur present.   Assessment:   1. Plantar fasciitis of right foot   2. Foot pain, right    Plan:  Patient was evaluated and treated and  all questions answered.  Plantar Fasciitis, right - XR reviewed as above.  - Educated on icing and stretching. Instructions given.  -I will hold off on another set of injections as a previous injection not help at all. - DME: Continue utilizing cam boot. - Pharmacologic management: Meloxicam and Medrol Dosepak -At this time I extensively discussed with the patient and his other treatment options which includes physical therapy EPAT as well as surgical intervention.  At this time patient would like to think about surgical intervention as  well as other treatment modalities and get back to me.  I discussed with him that if he continues to get worse we may have to progress with surgical intervention at this time.  He states understanding.  Pes planovalgus -I explained patient the etiology of pes planovalgus and various treatment options were discussed especially with his history of plantar fasciitis.  Given that he is constantly on his foot and work boots he needs good supportive sneakers as well as orthotics.  I discussed this with the patient.  He will he will benefit from custom-made orthotics for his foot. -He will pick up with orthotics with [facility] in few weeks.  He has been casted for them  No follow-ups on file.

## 2020-01-06 ENCOUNTER — Other Ambulatory Visit: Payer: Self-pay | Admitting: Cardiovascular Disease

## 2020-01-06 NOTE — Telephone Encounter (Signed)
Rx request sent to pharmacy.  

## 2020-01-29 ENCOUNTER — Other Ambulatory Visit: Payer: Self-pay

## 2020-01-29 ENCOUNTER — Ambulatory Visit (INDEPENDENT_AMBULATORY_CARE_PROVIDER_SITE_OTHER): Payer: BC Managed Care – PPO | Admitting: Orthotics

## 2020-01-29 DIAGNOSIS — M722 Plantar fascial fibromatosis: Secondary | ICD-10-CM

## 2020-02-03 ENCOUNTER — Other Ambulatory Visit: Payer: Self-pay

## 2020-02-03 ENCOUNTER — Ambulatory Visit: Payer: BC Managed Care – PPO | Admitting: Family

## 2020-02-03 ENCOUNTER — Encounter: Payer: Self-pay | Admitting: Family

## 2020-02-03 VITALS — BP 138/82 | HR 62 | Ht 73.0 in | Wt 216.0 lb

## 2020-02-03 DIAGNOSIS — G4733 Obstructive sleep apnea (adult) (pediatric): Secondary | ICD-10-CM

## 2020-02-03 DIAGNOSIS — I493 Ventricular premature depolarization: Secondary | ICD-10-CM

## 2020-02-03 DIAGNOSIS — I10 Essential (primary) hypertension: Secondary | ICD-10-CM | POA: Diagnosis not present

## 2020-02-03 DIAGNOSIS — E782 Mixed hyperlipidemia: Secondary | ICD-10-CM | POA: Diagnosis not present

## 2020-02-03 DIAGNOSIS — Z9989 Dependence on other enabling machines and devices: Secondary | ICD-10-CM

## 2020-02-03 DIAGNOSIS — Z23 Encounter for immunization: Secondary | ICD-10-CM | POA: Diagnosis not present

## 2020-02-03 MED ORDER — LOSARTAN POTASSIUM 100 MG PO TABS: 100.0000 mg | ORAL_TABLET | Freq: Every day | ORAL | 2 refills | Status: DC

## 2020-02-03 MED ORDER — LOVASTATIN 20 MG PO TABS: 20.0000 mg | ORAL_TABLET | Freq: Every day | ORAL | 2 refills | Status: DC

## 2020-02-03 MED ORDER — AMLODIPINE BESYLATE 10 MG PO TABS: 10.0000 mg | ORAL_TABLET | Freq: Every day | ORAL | 1 refills | Status: DC

## 2020-02-03 MED ORDER — DOXAZOSIN MESYLATE 8 MG PO TABS: 8.0000 mg | ORAL_TABLET | Freq: Every day | ORAL | 2 refills | Status: DC

## 2020-02-03 NOTE — Progress Notes (Signed)
Office Visit    Patient Name: Joshua Mann Date of Encounter: 02/03/2020  Primary Care Provider:  Alvina Filbert, MD Primary Cardiologist:  Julien Nordmann, MD Electrophysiologist:  None   Chief Complaint    Joshua Mann is a 59 y.o. male with a hx of OSA on CPAP, PACU, HLD, HTN, bradycardia presents today for follow-up of HTN  Past Medical History    Past Medical History:  Diagnosis Date  . Bruit 10/14/2009   Asymptomatic Bruit, Normal Carotid Doppler  . Chest pain 10/14/2009   Stress Test demontrated a small fixed Inferolateral perfusion defect  . Claudication (HCC) 10/14/2009   Normal Lower Arterial Doppler  . Hypertension    controlled  . Mixed dyslipidemia   . Murmur 10/31/2008   Echo EF >55%  AV apear mildly Sclerotic, No AS  . Obstructive sleep apnea    on c-pap   Past Surgical History:  Procedure Laterality Date  . HEEL SPUR EXCISION    . LUMBAR LAMINECTOMY/DECOMPRESSION MICRODISCECTOMY Right 06/07/2017   Procedure: LUMBAR LAMINECTOMY/DECOMPRESSION MICRODISCECTOMY 1 LEVEL-L5-S1;  Surgeon: Venetia Night, MD;  Location: [facility] ORS;  Service: Neurosurgery;  Laterality: Right;  . Ulcers  2002   Stomach Bleeding    Allergies  Allergies  Allergen Reactions  . Lisinopril Cough    History of Present Illness    Joshua Mann is a 59 y.o. male with a hx of  OSA on CPAP, PACU, HLD, HTN, bradycardia last seen 01/2019 by Dr. Mariah Milling.  He wore previous Holter monitor 2014 which showed infrequent PACs. He was last seen by Dr. Mariah Milling 01/2019. His blood pressure was well controlled and no changes were made at that time. He was noted to be mildly bradycardic but asymptomatic.  More recently he has been following with podiatry for plantar fasciitis.  Tells me his foot pain has been reasonably well controlled since last seen.  He presents today for an annual follow-up of hypertension.  Reports checking blood pressure intermittently at home with  readings consistently 130s over 80s on his arm cuff.  Tells me his blood pressure has been at goal of less than 130/80 for a couple of months.  He endorses both eating at home and eating out.  Endorses he has potentially been eating a bit more salt than previous.  No formal exercise routine though does stay very active at work.  Reports no shortness of breath nor dyspnea on exertion. Reports no chest pain, pressure, or tightness. No orthopnea, PND. Reports no palpitations.  Reports very mild lower extremity edema at the end of a long day standing but this self resolves by the morning.   EKGs/Labs/Other Studies Reviewed:   The following studies were reviewed today:  EKG:  EKG is  ordered today.  The ekg ordered today demonstrates NSR 62 bpm with single PVC and no acute ST/T wave changes.  Noted moderate voltage criteria for LVH  Recent Labs: No results found for requested labs within last 8760 hours.  Recent Lipid Panel    Component Value Date/Time   CHOL 170 11/13/2015 1014   TRIG 285 (H) 11/13/2015 1014   HDL 33 (L) 11/13/2015 1014   CHOLHDL 5.2 (H) 11/13/2015 1014   LDLCALC 80 11/13/2015 1014    Home Medications   Current Meds  Medication Sig  . doxazosin (CARDURA) 8 MG tablet Take 1 tablet (8 mg total) by mouth daily.  Marland Kitchen losartan (COZAAR) 100 MG tablet Take 1 tablet (100 mg total) by mouth daily.  Marland Kitchen  lovastatin (MEVACOR) 20 MG tablet Take 1 tablet (20 mg total) by mouth at bedtime.  . [DISCONTINUED] amLODipine (NORVASC) 5 MG tablet Take 1 tablet (5 mg total) by mouth daily.  . [DISCONTINUED] doxazosin (CARDURA) 8 MG tablet Take 1 tablet by mouth once daily  . [DISCONTINUED] losartan (COZAAR) 100 MG tablet Take 1 tablet (100 mg total) by mouth daily. Please schedule appointment with Dr. Mariah Milling for refills.  . [DISCONTINUED] lovastatin (MEVACOR) 20 MG tablet Take 1 tablet (20 mg total) by mouth at bedtime.     Review of Systems  All other systems reviewed and are otherwise  negative except as noted above.  Physical Exam    VS:  BP 138/82 (BP Location: Left Arm, Patient Position: Sitting, Cuff Size: Normal)   Pulse 62   Ht 6\' 1"  (1.854 m)   Wt 216 lb (98 kg)   SpO2 97%   BMI 28.50 kg/m  , BMI Body mass index is 28.5 kg/m.  Wt Readings from Last 3 Encounters:  02/03/20 216 lb (98 kg)  01/22/19 210 lb (95.3 kg)  01/01/19 213 lb (96.6 kg)    GEN: Well nourished, well developed, in no acute distress. HEENT: normal. Neck: Supple, no JVD, carotid bruits, or masses. Cardiac: RRR, no murmurs, rubs, or gallops. No clubbing, cyanosis, edema.  Radials/DP/PT 2+ and equal bilaterally.  Respiratory:  Respirations regular and unlabored, clear to auscultation bilaterally. GI: Soft, nontender, nondistended. MS: No deformity or atrophy. Skin: Warm and dry, no rash. Neuro:  Strength and sensation are intact. Psych: Normal affect.  Assessment & Plan    1. HTN-BP not at goal of less than 130/80.  Increase amlodipine to 10 daily.  Continue Cardura 8 mg daily, losartan 100 mg daily.  Renal and liver function 03/2019 normal.  She will monitor BP at home and report BP consistently greater than 130/80.  2. Need for vaccination against influenza - Flu vaccine provided today.  3. HLD-03/2019 LDL 127 on lab work from work.  10-year ASCVD 70.5% risk.  To avoid multiple medication changes this visit he will continue his lovastatin 20 mg daily and make lifestyle changes.  Handout on lipid-lowering diet provided.  Plan for repeat lipid panel at follow-up and if LDL not at goal of less than 100 escalate statin therapy.  4. OSA-continue CPAP compliance encouraged.  5. PAC / Sinus bradycardia / PVC - PAC noted on prior monitor 2014.  Isolated PVC noted on EKG today.  Reports rare intermittent palpitations which are not bothersome.  No indication for beta-blocker or calcium channel blocker at this time.  No evidence of recurrent sinus bradycardia.  No lightheadedness, dizziness,  near-syncope, syncope.  Disposition: Follow up in 6 month(s) with Dr. 04/2019 or APP  Signed, Mariah Milling, NP 02/03/2020, 4:16 PM [facility]

## 2020-02-03 NOTE — Patient Instructions (Signed)
Medication Instructions:  Your physician has recommended you make the following change in your medication:   CHANGE Amlodipine to 10mg  once daily *This will help get your blood pressure to goal of less than 130/80  *If you need a refill on your cardiac medications before your next appointment, please call your pharmacy*   Lab Work: None ordered today. Your lab work in January showed your kidneys & liver were good! Your LDL or "bad cholesterol" was a little higher than we like. It was 127 and we want it less than 100. Keep taking your Lovastatin and recommend adjusting your diet - additional information below.   Testing/Procedures: Your EKG today showed normal sinus rhythm with a single early beat. This is not dangerous but can feel like your heart "jumps" or "skips a beat". These can be triggered by stress, alcohol, caffeine, and being dehydrated.   Follow-Up: At [facility], you and your health needs are our priority.  As part of our continuing mission to provide you with exceptional heart care, we have created designated Provider Care Teams.  These Care Teams include your primary Cardiologist (physician) and Advanced Practice Providers (APPs -  Physician Assistants and Nurse Practitioners) who all work together to provide you with the care you need, when you need it.  We recommend signing up for the patient portal called "MyChart".  Sign up information is provided on this After Visit Summary.  MyChart is used to connect with patients for Virtual Visits (Telemedicine).  Patients are able to view lab/test results, encounter notes, upcoming appointments, etc.  Non-urgent messages can be sent to your provider as well.   To learn more about what you can do with MyChart, go to [facility].    Your next appointment:   6 month(s)  The format for your next appointment:   In Person  Provider:   You may see [email or web address], MD or one of the following Advanced Practice Providers on  your designated Care Team:    Julien Nordmann, NP  Nicolasa Ducking, PA-C  Eula Listen, PA-C  Cadence Marisue Ivan, Fransico Machai  [place], NP  Other Instructions  Tips to Measure your Blood Pressure Correctly Our goal is for your BP to be less than 130/80 To determine whether you have hypertension, a medical professional will take a blood pressure reading. How you prepare for the test, the position of your arm, and other factors can change a blood pressure reading by 10% or more. That could be enough to hide high blood pressure, start you on a drug you don't really need, or lead your doctor to incorrectly adjust your medications.  National and international guidelines offer specific instructions for measuring blood pressure. If a doctor, nurse, or medical assistant isn't doing it right, don't hesitate to ask him or her to get with the guidelines.  Here's what you can do to ensure a correct reading:  Don't drink a caffeinated beverage or smoke during the 30 minutes before the test.  Sit quietly for five minutes before the test begins.  During the measurement, sit in a chair with your feet on the floor and your arm supported so your elbow is at about heart level.  The inflatable part of the cuff should completely cover at least 80% of your upper arm, and the cuff should be placed on bare skin, not over a shirt.  Don't talk during the measurement.  Have your blood pressure measured twice, with a brief break in between. If the readings are different  by 5 points or more, have it done a third time.  There are times to break these rules. If you sometimes feel lightheaded when getting out of bed in the morning or when you stand after sitting, you should have your blood pressure checked while seated and then while standing to see if it falls from one position to the next.  Because blood pressure varies throughout the day, your doctor will rarely diagnose hypertension on the basis of a single  reading. Instead, he or she will want to confirm the measurements on at least two occasions, usually within a few weeks of one another. The exception to this rule is if you have a blood pressure reading of 180/110 mm Hg or higher. A result this high usually calls for prompt treatment.  It's a good idea to have your blood pressure measured in both arms at least once, since the reading in one arm (usually the right) may be higher than that in the left. A 2014 study in The American Journal of Medicine of nearly 3,400 people found average arm- to-arm differences in systolic blood pressure of about 5 points. The higher number should be used to make treatment decisions.  In 2017, new guidelines from the American Heart Association, the [employer] of Cardiology, and nine other health organizations lowered the diagnosis of high blood pressure to 130/80 mm Hg or higher for all adults. The guidelines also redefined the various blood pressure categories to now include normal, elevated, Stage 1 hypertension, Stage 2 hypertension, and hypertensive crisis (see "Blood pressure categories").  Blood pressure categories  Blood pressure category SYSTOLIC (upper number)  DIASTOLIC (lower number)  Normal Less than 120 mm Hg and Less than 80 mm Hg  Elevated 120-129 mm Hg and Less than 80 mm Hg  High blood pressure: Stage 1 hypertension 130-139 mm Hg or 80-89 mm Hg  High blood pressure: Stage 2 hypertension 140 mm Hg or higher or 90 mm Hg or higher  Hypertensive crisis (consult your doctor immediately) Higher than 180 mm Hg and/or Higher than 120 mm Hg  Source: American Heart Association and American Stroke Association. For more on getting your blood pressure under control, buy Controlling Your Blood Pressure, a Special Health Report from [facility].   Blood Pressure Log   Date   Time  Blood Pressure  Position  Example: Nov 1 9 AM 124/78 sitting                                                       Fat and Cholesterol Restricted Eating Plan Getting too much fat and cholesterol in your diet may cause health problems. Choosing the right foods helps keep your fat and cholesterol at normal levels. This can keep you from getting certain diseases. What are tips for following this plan? Meal planning  At meals, divide your plate into four equal parts: ? Fill one-half of your plate with vegetables and green salads. ? Fill one-fourth of your plate with whole grains. ? Fill one-fourth of your plate with low-fat (lean) protein foods.  Eat fish that is high in omega-3 fats at least two times a week. This includes mackerel, tuna, sardines, and salmon.  Eat foods that are high in fiber, such as whole grains, beans, apples, broccoli, carrots, peas, and barley. General tips  Work with your doctor to lose weight if you need to.  Avoid: ? Foods with added sugar. ? Fried foods. ? Foods with partially hydrogenated oils.  Limit alcohol intake to no more than 1 drink a day for nonpregnant women and 2 drinks a day for men. One drink equals 12 oz of beer, 5 oz of wine, or 1 oz of hard liquor. Reading food labels  Check food labels for: ? Trans fats. ? Partially hydrogenated oils. ? Saturated fat (g) in each serving. ? Cholesterol (mg) in each serving. ? Fiber (g) in each serving.  Choose foods with healthy fats, such as: ? Monounsaturated fats. ? Polyunsaturated fats. ? Omega-3 fats.  Choose grain products that have whole grains. Look for the word "whole" as the first word in the ingredient list. Cooking  Cook foods using low-fat methods. These include baking, boiling, grilling, and broiling.  Eat more home-cooked foods. Eat at restaurants and buffets less often.  Avoid cooking using saturated fats, such as butter, cream, palm oil, palm kernel oil, and coconut oil. Recommended foods  Fruits  All fresh, canned (in natural juice), or frozen  fruits. Vegetables  Fresh or frozen vegetables (raw, steamed, roasted, or grilled). Green salads. Grains  Whole grains, such as whole wheat or whole grain breads, crackers, cereals, and pasta. Unsweetened oatmeal, bulgur, barley, quinoa, or brown rice. Corn or whole wheat flour tortillas. Meats and other protein foods  Ground beef (85% or leaner), grass-fed beef, or beef trimmed of fat. Skinless chicken or [place]. Ground chicken or [place]. Pork trimmed of fat. All fish and seafood. Egg whites. Dried beans, peas, or lentils. Unsalted nuts or seeds. Unsalted canned beans. Nut butters without added sugar or oil. Dairy  Low-fat or nonfat dairy products, such as skim or 1% milk, 2% or reduced-fat cheeses, low-fat and fat-free ricotta or cottage cheese, or plain low-fat and nonfat yogurt. Fats and oils  Tub margarine without trans fats. Light or reduced-fat mayonnaise and salad dressings. Avocado. Olive, canola, sesame, or safflower oils. The items listed above may not be a complete list of foods and beverages you can eat. Contact a dietitian for more information. Foods to avoid Fruits  Canned fruit in heavy syrup. Fruit in cream or butter sauce. Fried fruit. Vegetables  Vegetables cooked in cheese, cream, or butter sauce. Fried vegetables. Grains  White bread. White pasta. White rice. Cornbread. Bagels, pastries, and croissants. Crackers and snack foods that contain trans fat and hydrogenated oils. Meats and other protein foods  Fatty cuts of meat. Ribs, chicken wings, bacon, sausage, bologna, salami, chitterlings, fatback, hot dogs, bratwurst, and packaged lunch meats. Liver and organ meats. Whole eggs and egg yolks. Chicken and [place] with skin. Fried meat. Dairy  Whole or 2% milk, cream, half-and-half, and cream cheese. Whole milk cheeses. Whole-fat or sweetened yogurt. Full-fat cheeses. Nondairy creamers and whipped toppings. Processed cheese, cheese spreads, and cheese  curds. Beverages  Alcohol. Sugar-sweetened drinks such as sodas, lemonade, and fruit drinks. Fats and oils  Butter, stick margarine, lard, shortening, ghee, or bacon fat. Coconut, palm kernel, and palm oils. Sweets and desserts  Corn syrup, sugars, honey, and molasses. Candy. Jam and jelly. Syrup. Sweetened cereals. Cookies, pies, cakes, donuts, muffins, and ice cream. The items listed above may not be a complete list of foods and beverages you should avoid. Contact a dietitian for more information. Summary  Choosing the right foods helps keep your fat and cholesterol at normal levels. This can keep you from getting  certain diseases.  At meals, fill one-half of your plate with vegetables and green salads.  Eat high-fiber foods, like whole grains, beans, apples, carrots, peas, and barley.  Limit added sugar, saturated fats, alcohol, and fried foods. This information is not intended to replace advice given to you by your health care provider. Make sure you discuss any questions you have with your health care provider. Document Revised: 11/08/2017 Document Reviewed: 11/22/2016 Elsevier Patient Education  2020 [employer]

## 2020-02-05 ENCOUNTER — Encounter: Payer: BC Managed Care – PPO | Admitting: Orthotics

## 2020-02-18 ENCOUNTER — Ambulatory Visit: Payer: BC Managed Care – PPO | Admitting: Podiatry

## 2020-04-21 ENCOUNTER — Other Ambulatory Visit: Payer: Self-pay | Admitting: Cardiovascular Disease

## 2020-05-25 ENCOUNTER — Telehealth: Payer: Self-pay | Admitting: *Deleted

## 2020-05-25 NOTE — Telephone Encounter (Signed)
I had your name written on a sticky.  I am not sure why but I wanted to follow-up and see if you are okay or if you need an appointment.  I'm doing okay. I have a little bit of pain but I'm okay."  We are here if you need [place], so, give [place] a call.  "I will."

## 2020-08-04 ENCOUNTER — Ambulatory Visit: Payer: BC Managed Care – PPO | Admitting: Cardiovascular Disease

## 2020-08-04 ENCOUNTER — Other Ambulatory Visit: Payer: Self-pay

## 2020-08-04 ENCOUNTER — Encounter: Payer: Self-pay | Admitting: Cardiovascular Disease

## 2020-08-04 VITALS — BP 120/62 | HR 63 | Ht 73.0 in | Wt 216.1 lb

## 2020-08-04 DIAGNOSIS — I493 Ventricular premature depolarization: Secondary | ICD-10-CM

## 2020-08-04 DIAGNOSIS — E782 Mixed hyperlipidemia: Secondary | ICD-10-CM

## 2020-08-04 DIAGNOSIS — G4733 Obstructive sleep apnea (adult) (pediatric): Secondary | ICD-10-CM

## 2020-08-04 DIAGNOSIS — I10 Essential (primary) hypertension: Secondary | ICD-10-CM

## 2020-08-04 DIAGNOSIS — R0789 Other chest pain: Secondary | ICD-10-CM

## 2020-08-04 DIAGNOSIS — R001 Bradycardia, unspecified: Secondary | ICD-10-CM

## 2020-08-04 DIAGNOSIS — Z9989 Dependence on other enabling machines and devices: Secondary | ICD-10-CM

## 2020-08-04 NOTE — Progress Notes (Signed)
Date:  08/04/2020   ID:  Joshua Mann, DOB [birth year], MRN [id number]  Patient Location:  [address] [place] [address]   Provider location:   [facility], [place] office  PCP:  Alvina Filbert, MD  Cardiologist:  Hubbard Robinson [facility]   Chief Complaint  Patient presents with  . 6 month follow up     "doing well." Medications reviewed by the patient verbally.     History of Present Illness:    Joshua Mann is a 60 y.o. male  past medical history of obstructive sleep apnea on CPAP,  PACs on Holter monitor Prior cholesterol more than 220 Chronic leg cramping unrelated to statin  presents for followup of his hypertension and high cholesterol, bradycardia.  Continues to have cramps Cramps in bed at night, typically does not have cramps in the day Will turn in bed stimulating muscle cramping Has been off lovastatin several weeks no improvement in symptoms Prior hold of statins again with no improvement of symptoms Very active in the daytime, reports drinking lots of water  Long working hours, starts at 5 AM, manual labor using legs and hands  Blood pressure stable Denies shortness of breath, no chest pain, no leg edema Denies tachypalpitations  Lab work reviewed with him Prior lipid numbers, total chol 172, LDL 72 , up to 200 cholesterol without the statin  EKG personally reviewed by myself on todays visit Shows normal sinus rhythm rate 63 bpm no significant ST-T wave changes    Past Medical History:  Diagnosis Date  . Bruit 10/14/2009   Asymptomatic Bruit, Normal Carotid Doppler  . Chest pain 10/14/2009   Stress Test demontrated a small fixed Inferolateral perfusion defect  . Claudication ([facility]) 10/14/2009   Normal Lower Arterial Doppler  . Hypertension    controlled  . Mixed dyslipidemia   . Murmur 10/31/2008   Echo EF >55%  AV apear mildly Sclerotic, No AS  . Obstructive sleep apnea    on c-pap   Past  Surgical History:  Procedure Laterality Date  . HEEL SPUR EXCISION    . LUMBAR LAMINECTOMY/DECOMPRESSION MICRODISCECTOMY Right 06/07/2017   Procedure: LUMBAR LAMINECTOMY/DECOMPRESSION MICRODISCECTOMY 1 LEVEL-L5-S1;  Surgeon: Venetia Night, MD;  Location: [facility] ORS;  Service: Neurosurgery;  Laterality: Right;  . Ulcers  2002   Stomach Bleeding     Current Meds  Medication Sig  . amLODipine (NORVASC) 10 MG tablet Take 1 tablet (10 mg total) by mouth daily.  Marland Kitchen aspirin 81 MG EC tablet Take 81 mg by mouth daily.  Marland Kitchen doxazosin (CARDURA) 8 MG tablet Take 1 tablet (8 mg total) by mouth daily.  Marland Kitchen losartan (COZAAR) 100 MG tablet Take 1 tablet (100 mg total) by mouth daily.  Marland Kitchen lovastatin (MEVACOR) 20 MG tablet Take 1 tablet (20 mg total) by mouth at bedtime.     Allergies:   Lisinopril   Social History   Tobacco Use  . Smoking status: Former Smoker    Packs/day: 1.00    Years: 12.00    Pack years: 12.00    Types: Cigarettes    Quit date: 03/21/1982    Years since quitting: 38.4  . Smokeless tobacco: Never Used  Vaping Use  . Vaping Use: Never used  Substance Use Topics  . Alcohol use: No  . Drug use: No     Current Outpatient Medications on File Prior to Visit  Medication Sig Dispense Refill  . amLODipine (NORVASC) 10 MG tablet  Take 1 tablet (10 mg total) by mouth daily. 90 tablet 1  . aspirin 81 MG EC tablet Take 81 mg by mouth daily.    Marland Kitchen doxazosin (CARDURA) 8 MG tablet Take 1 tablet (8 mg total) by mouth daily. 90 tablet 2  . losartan (COZAAR) 100 MG tablet Take 1 tablet (100 mg total) by mouth daily. 90 tablet 2  . lovastatin (MEVACOR) 20 MG tablet Take 1 tablet (20 mg total) by mouth at bedtime. 90 tablet 2   No current facility-administered medications on file prior to visit.     Family Hx: The patient's family history includes Cancer (age of onset: 41) in his sister; Cancer (age of onset: 44) in his father; Hypertension in his mother.  ROS:   Please see the history of  present illness.    Review of Systems  Constitutional: Negative.   HENT: Negative.   Respiratory: Negative.   Cardiovascular: Negative.   Gastrointestinal: Negative.   Musculoskeletal: Positive for myalgias.       Cramps in the legs  Neurological: Negative.   Psychiatric/Behavioral: Negative.   All other systems reviewed and are negative.    Labs/Other Tests and Data Reviewed:    Recent Labs: No results found for requested labs within last 8760 hours.   Recent Lipid Panel Lab Results  Component Value Date/Time   CHOL 170 11/13/2015 10:14 AM   TRIG 285 (H) 11/13/2015 10:14 AM   HDL 33 (L) 11/13/2015 10:14 AM   CHOLHDL 5.2 (H) 11/13/2015 10:14 AM   LDLCALC 80 11/13/2015 10:14 AM    Wt Readings from Last 3 Encounters:  08/04/20 216 lb 2 oz (98 kg)  02/03/20 216 lb (98 kg)  01/22/19 210 lb (95.3 kg)     Exam:    Vital Signs: Vital signs may also be detailed in the HPI BP 120/62 (BP Location: Left Arm, Patient Position: Sitting, Cuff Size: Normal)   Pulse 63   Ht 6\' 1"  (1.854 m)   Wt 216 lb 2 oz (98 kg)   SpO2 97%   BMI 28.51 kg/m    Constitutional:  oriented to person, place, and time. No distress.  HENT:  Head: Grossly normal Eyes:  no discharge. No scleral icterus.  Neck: No JVD, no carotid bruits  Cardiovascular: Regular rate and rhythm, no murmurs appreciated Pulmonary/Chest: Clear to auscultation bilaterally, no wheezes or rails Abdominal: Soft.  no distension.  no tenderness.  Musculoskeletal: Normal range of motion Neurological:  normal muscle tone. Coordination normal. No atrophy Skin: Skin warm and dry Psychiatric: normal affect, pleasant  ASSESSMENT & PLAN:    Essential hypertension Blood pressure is well controlled on today's visit. No changes made to the medications.  Chronic leg cramps Recommended electrolyte supplementation in the evenings Suggested regular stretching program Hold statin for now  Mixed hyperlipidemia Suggested screening  study with CT coronary calcium scoring For elevated calcium score could try Zetia For now we will try to avoid statins given chronic cramping although symptoms likely unrelated to statins given holding the medication does not help her symptoms  Bradycardia Asymptomatic  Chest tightness Denies recent symptoms, calcium scoring as above  OSA on CPAP Compliant with his CPAP Stable  Palpitations Stable symptoms, no further work-up No further episodes   Total encounter time more than 25 minutes  Greater than 50% was spent in counseling and coordination of care with the patient   Signed, , MD  08/04/2020 4:34 PM    [facility]  [employer] [address], [place], [place] [address]

## 2020-08-04 NOTE — Patient Instructions (Addendum)
Medication Instructions:  No changes, continue to hold the lovastatin  Try CoQ 10 for cramps  If you need a refill on your cardiac medications before your next appointment, please call your pharmacy.    Lab work: No new labs needed  Testing/Procedures: We will order CT coronary calcium score $99 out of pocket expense at our [facility] in [place]  This procedure uses special x-ray equipment to produce pictures of the coronary arteries to determine if they are blocked or narrowed by the buildup of plaque - an indicator for atherosclerosis or coronary artery disease (CAD).  Please call [phone] to schedule at your earliest convince   [facility] [address] [place], [place] [address]  Follow-Up:  . You will need a follow up appointment in 12 months  . Providers on your designated Care Team:   . Nicolasa Ducking, NP . Eula Listen, PA-C . Marisue Ivan, PA-C  COVID-19 Vaccine Information can be found at: [email or web address] For questions related to vaccine distribution or appointments, please email [email or web address] or call [phone].

## 2020-09-01 ENCOUNTER — Other Ambulatory Visit: Payer: Self-pay

## 2020-09-01 ENCOUNTER — Ambulatory Visit
Admission: RE | Admit: 2020-09-01 | Discharge: 2020-09-01 | Disposition: A | Discharge status: Home / Self Care | Payer: BC Managed Care – PPO | Source: Ambulatory Visit | Attending: Cardiovascular Disease | Admitting: Cardiovascular Disease

## 2020-09-01 DIAGNOSIS — E782 Mixed hyperlipidemia: Secondary | ICD-10-CM | POA: Insufficient documentation

## 2020-09-01 DIAGNOSIS — R0789 Other chest pain: Secondary | ICD-10-CM | POA: Insufficient documentation

## 2020-09-01 DIAGNOSIS — I10 Essential (primary) hypertension: Secondary | ICD-10-CM

## 2020-09-08 ENCOUNTER — Telehealth: Payer: Self-pay | Admitting: *Deleted

## 2020-09-08 NOTE — Telephone Encounter (Signed)
-----   Message from Antonieta Iba, MD sent at 09/06/2020  2:19 PM EDT -----  CT coronary calcium score Score of 0 Excellent finding, indicating no calcified coronary atherosclerotic plaque noted  No other significant findings on the scan were picked up

## 2020-09-08 NOTE — Telephone Encounter (Signed)
Reviewed results with patient and he verbalized understanding with no further questions at this time. 

## 2020-09-22 NOTE — Progress Notes (Signed)
Patient in office today to pick-up custom orthotics. EJ with [facility] educated the patient on the break-in process at this time. Patient tried the orthotics on and was satisfied with the fit. Advised patient to call the office with any questions, comments, or concerns. Patient verbalized understanding.

## 2021-01-12 ENCOUNTER — Other Ambulatory Visit: Payer: Self-pay | Admitting: Family

## 2021-03-01 DIAGNOSIS — Z125 Encounter for screening for malignant neoplasm of prostate: Secondary | ICD-10-CM | POA: Diagnosis not present

## 2021-03-01 DIAGNOSIS — I10 Essential (primary) hypertension: Secondary | ICD-10-CM | POA: Diagnosis not present

## 2021-03-01 DIAGNOSIS — E785 Hyperlipidemia, unspecified: Secondary | ICD-10-CM | POA: Diagnosis not present

## 2021-03-01 DIAGNOSIS — R350 Frequency of micturition: Secondary | ICD-10-CM | POA: Diagnosis not present

## 2021-03-01 DIAGNOSIS — J329 Chronic sinusitis, unspecified: Secondary | ICD-10-CM | POA: Diagnosis not present

## 2021-03-01 DIAGNOSIS — R609 Edema, unspecified: Secondary | ICD-10-CM | POA: Diagnosis not present

## 2021-03-16 ENCOUNTER — Other Ambulatory Visit: Payer: Self-pay | Admitting: Family

## 2021-04-19 DIAGNOSIS — I10 Essential (primary) hypertension: Secondary | ICD-10-CM | POA: Diagnosis not present

## 2021-04-19 DIAGNOSIS — R0981 Nasal congestion: Secondary | ICD-10-CM | POA: Diagnosis not present

## 2021-04-19 DIAGNOSIS — Z6828 Body mass index (BMI) 28.0-28.9, adult: Secondary | ICD-10-CM | POA: Diagnosis not present

## 2021-04-30 ENCOUNTER — Other Ambulatory Visit: Payer: Self-pay | Admitting: Family

## 2021-06-21 ENCOUNTER — Other Ambulatory Visit: Payer: Self-pay | Admitting: Family

## 2021-07-06 ENCOUNTER — Ambulatory Visit: Payer: BC Managed Care – PPO | Admitting: Podiatry

## 2021-07-06 DIAGNOSIS — R200 Anesthesia of skin: Secondary | ICD-10-CM

## 2021-07-06 DIAGNOSIS — Z8739 Personal history of other diseases of the musculoskeletal system and connective tissue: Secondary | ICD-10-CM

## 2021-07-06 DIAGNOSIS — R202 Paresthesia of skin: Secondary | ICD-10-CM

## 2021-07-06 MED ORDER — PREGABALIN 75 MG PO CAPS: 75.0000 mg | ORAL_CAPSULE | Freq: Two times a day (BID) | ORAL | 0 refills | Status: AC

## 2021-07-06 NOTE — Progress Notes (Signed)
?Subjective:  ?Patient ID: Joshua Mann, male    DOB: [birth year],  MRN: [id number] ? ?Chief Complaint  ?Patient presents with  ? Numbness  ? ? ?61 y.o. male presents with the above complaint.  Patient presents with complaint of bilateral foot and ankle numbness.  Patient states that it has been going for quite some time has progressive gotten worse.  He states that it started from the toes and is working his way back up.  He is not a diabetic he has not seen anyone as prior to seeing me.  He does have history of back surgery as well as back pain.  He has not seen his back specialist.  He is trying to get into see them.  He denies any other acute complaints ? ? ?Review of Systems: Negative except as noted in the HPI. Denies N/V/F/Ch. ? ?Past Medical History:  ?Diagnosis Date  ? Bruit 10/14/2009  ? Asymptomatic Bruit, Normal Carotid Doppler  ? Chest pain 10/14/2009  ? Stress Test demontrated a small fixed Inferolateral perfusion defect  ? Claudication ([place]) 10/14/2009  ? Normal Lower Arterial Doppler  ? Hypertension   ? controlled  ? Mixed dyslipidemia   ? Murmur 10/31/2008  ? Echo EF >55%  AV apear mildly Sclerotic, No AS  ? Obstructive sleep apnea   ? on c-pap  ? ? ?Current Outpatient Medications:  ?  pregabalin (LYRICA) 75 MG capsule, Take 1 capsule (75 mg total) by mouth 2 (two) times daily., Disp: 30 capsule, Rfl: 0 ?  amLODipine (NORVASC) 10 MG tablet, Take 1 tablet by mouth once daily, Disp: 90 tablet, Rfl: 3 ?  aspirin 81 MG EC tablet, Take 81 mg by mouth daily., Disp: , Rfl:  ?  doxazosin (CARDURA) 8 MG tablet, Take 1 tablet by mouth once daily, Disp: 90 tablet, Rfl: 3 ?  losartan (COZAAR) 100 MG tablet, Take 1 tablet by mouth once daily, Disp: 90 tablet, Rfl: 0 ?  lovastatin (MEVACOR) 20 MG tablet, Take 1 tablet (20 mg total) by mouth at bedtime., Disp: 90 tablet, Rfl: 2 ? ?Social History  ? ?Tobacco Use  ?Smoking Status Former  ? Packs/day: 1.00  ? Years: 12.00  ? Pack years: 12.00  ? Types:  Cigarettes  ? Quit date: 03/21/1982  ? Years since quitting: 39.3  ?Smokeless Tobacco Never  ? ? ?Allergies  ?Allergen Reactions  ? Lisinopril Cough  ? ?Objective:  ?There were no vitals filed for this visit. ?There is no height or weight on file to calculate BMI. ?Constitutional Well developed. ?Well nourished.  ?Vascular Dorsalis pedis pulses palpable bilaterally. ?Posterior tibial pulses palpable bilaterally. ?Capillary refill normal to all digits.  ?No cyanosis or clubbing noted. ?Pedal hair growth normal.  ?Neurologic Normal speech. ?Oriented to person, place, and time. ?Epicritic sensation to light touch grossly present bilaterally.  Subjective component of numbness tingling noted to both lower extremity from digits to the midfoot.  Negative Tinel's sign to the tarsal tunnel as well as common peroneal nerve  ?Dermatologic Nails well groomed and normal in appearance. ?No open wounds. ?No skin lesions.  ?Orthopedic: Manual muscle strength 5 out of 5 to all muscle groups  ? ?Radiographs: None ?Assessment:  ? ?1. Numbness and tingling   ?2. History of back pain   ? ?Plan:  ?Patient was evaluated and treated and all questions answered. ? ?Bilateral numbness tingling with a history of lower back pain ?-All questions and concerns were discussed with the patient in extensive detail ?-Given  that he has a history of lower back pain with history of back surgery I believe that this is likely where the numbness tingling is occurring.  Once again he does not have any Tinel's sign of the tarsal tunnel as well as the common peroneal nerve. ?-I encouraged him to go see a spine specialist.  He states understanding is working on getting an appointment. ?-If there is no improvement we can discuss or benefit from nerve conduction study ? ?No follow-ups on file.  ?

## 2021-07-08 ENCOUNTER — Ambulatory Visit: Payer: BC Managed Care – PPO | Admitting: Podiatry

## 2021-08-24 NOTE — Progress Notes (Unsigned)
Date:  08/25/2021   ID:  [name], DOB Jul 18, 1960, MRN [id number]  Patient Location:  [address] [place] [address]   Provider location:   [facility], [place] office  PCP:  Alvina Filbert, MD  Cardiologist:  Fonnie Mu   Chief Complaint  Patient presents with   12 month follow up     "Doing well." Medications reviewed by the patient verbally.     History of Present Illness:    CHRISTOPHERE HILLHOUSE is a 61 y.o. male  past medical history of obstructive sleep apnea off CPAP,  PACs on Holter monitor Prior cholesterol more than 220 Chronic leg cramping unrelated to statin Normal coronary calcium score of 0. In 6/22 presents for followup of his hypertension and high cholesterol, bradycardia.  LOV 5/22 In follow-up today reports he is doing relatively well Continues to work full-time Having some chronic back issues, had prior back surgery  Rare cramping in his legs Off statin  Daytime: losartan in AM Other blood pressure medications in the PM Feels blood pressure well controlled  Denies any chest pain or shortness of breath concerning for angina  Lab work reviewed with him No recent lab work available from primary care Prior lipid numbers, total chol 172, LDL 72 , up to 200 cholesterol without the statin  EKG personally reviewed by myself on todays visit Shows normal sinus rhythm rate 56 bpm no significant ST-T wave changes    Past Medical History:  Diagnosis Date   Bruit 10/14/2009   Asymptomatic Bruit, Normal Carotid Doppler   Chest pain 10/14/2009   Stress Test demontrated a small fixed Inferolateral perfusion defect   Claudication (HCC) 10/14/2009   Normal Lower Arterial Doppler   Hypertension    controlled   Mixed dyslipidemia    Murmur 10/31/2008   Echo EF >55%  AV apear mildly Sclerotic, No AS   Obstructive sleep apnea    on c-pap   Past Surgical History:  Procedure Laterality Date   HEEL  SPUR EXCISION     LUMBAR LAMINECTOMY/DECOMPRESSION MICRODISCECTOMY Right 06/07/2017   Procedure: LUMBAR LAMINECTOMY/DECOMPRESSION MICRODISCECTOMY 1 LEVEL-L5-S1;  Surgeon: Venetia Night, MD;  Location: [facility] ORS;  Service: Neurosurgery;  Laterality: Right;   Ulcers  2002   Stomach Bleeding     Current Meds  Medication Sig   amLODipine (NORVASC) 10 MG tablet Take 1 tablet by mouth once daily   aspirin 81 MG EC tablet Take 81 mg by mouth daily.   doxazosin (CARDURA) 8 MG tablet Take 1 tablet by mouth once daily   losartan (COZAAR) 100 MG tablet Take 1 tablet by mouth once daily   pregabalin (LYRICA) 75 MG capsule Take 1 capsule (75 mg total) by mouth 2 (two) times daily.     Allergies:   Lisinopril   Social History   Tobacco Use   Smoking status: Former    Packs/day: 1.00    Years: 12.00    Pack years: 12.00    Types: Cigarettes    Quit date: 03/21/1982    Years since quitting: 39.4   Smokeless tobacco: Never  Vaping Use   Vaping Use: Never used  Substance Use Topics   Alcohol use: No   Drug use: No     Current Outpatient Medications on File Prior to Visit  Medication Sig Dispense Refill   amLODipine (NORVASC) 10 MG tablet Take 1 tablet by mouth once daily 90 tablet 3   aspirin 81  MG EC tablet Take 81 mg by mouth daily.     doxazosin (CARDURA) 8 MG tablet Take 1 tablet by mouth once daily 90 tablet 3   losartan (COZAAR) 100 MG tablet Take 1 tablet by mouth once daily 90 tablet 0   pregabalin (LYRICA) 75 MG capsule Take 1 capsule (75 mg total) by mouth 2 (two) times daily. 30 capsule 0   No current facility-administered medications on file prior to visit.     Family Hx: The patient's family history includes Cancer (age of onset: 26) in his sister; Cancer (age of onset: 39) in his father; Hypertension in his mother.  ROS:   Please see the history of present illness.    Review of Systems  Constitutional: Negative.   HENT: Negative.    Respiratory: Negative.     Cardiovascular: Negative.   Gastrointestinal: Negative.   Musculoskeletal:  Positive for myalgias.       Cramps in the legs  Neurological: Negative.   Psychiatric/Behavioral: Negative.    All other systems reviewed and are negative.   Labs/Other Tests and Data Reviewed:    Recent Labs: No results found for requested labs within last 8760 hours.   Recent Lipid Panel Lab Results  Component Value Date/Time   CHOL 170 11/13/2015 10:14 AM   TRIG 285 (H) 11/13/2015 10:14 AM   HDL 33 (L) 11/13/2015 10:14 AM   CHOLHDL 5.2 (H) 11/13/2015 10:14 AM   LDLCALC 80 11/13/2015 10:14 AM    Wt Readings from Last 3 Encounters:  08/25/21 228 lb 8 oz (103.6 kg)  08/04/20 216 lb 2 oz (98 kg)  02/03/20 216 lb (98 kg)     Exam:    Vital Signs: Vital signs may also be detailed in the HPI BP 140/70 (BP Location: Left Arm, Patient Position: Sitting, Cuff Size: Normal)   Pulse (!) 56   Ht 6\' 1"  (1.854 m)   Wt 228 lb 8 oz (103.6 kg)   SpO2 97%   BMI 30.15 kg/m   Constitutional:  oriented to person, place, and time. No distress.  HENT:  Head: Grossly normal Eyes:  no discharge. No scleral icterus.  Neck: No JVD, no carotid bruits  Cardiovascular: Regular rate and rhythm, no murmurs appreciated Pulmonary/Chest: Clear to auscultation bilaterally, no wheezes or rails Abdominal: Soft.  no distension.  no tenderness.  Musculoskeletal: Normal range of motion Neurological:  normal muscle tone. Coordination normal. No atrophy Skin: Skin warm and dry Psychiatric: normal affect, pleasant  ASSESSMENT & PLAN:    Essential hypertension Blood pressure is well controlled on today's visit. No changes made to the medications.  Chronic leg cramps Recommended electrolyte supplementation in the evenings regular stretching program Continue to hold statin Calcium score of 0  Mixed hyperlipidemia CT coronary calcium scoring of 0 Not on statin given chronic leg cramping If needed down the road could  use Zetia  Bradycardia Asymptomatic  Chest tightness Low calcium score, no recent symptoms, no further ischemic work-up needed  OSA off CPAP Reports he no longer uses his CPAP  Palpitations No significant tachypalpitations    Total encounter time more than 30 minutes  Greater than 50% was spent in counseling and coordination of care with the patient   Signed, , MD  08/25/2021 4:11 PM    [facility] [address], [place], [place]

## 2021-08-25 ENCOUNTER — Encounter: Payer: Self-pay | Admitting: Cardiovascular Disease

## 2021-08-25 ENCOUNTER — Ambulatory Visit: Payer: BC Managed Care – PPO | Admitting: Cardiovascular Disease

## 2021-08-25 VITALS — BP 140/70 | HR 56 | Ht 73.0 in | Wt 228.5 lb

## 2021-08-25 DIAGNOSIS — I10 Essential (primary) hypertension: Secondary | ICD-10-CM

## 2021-08-25 DIAGNOSIS — G4733 Obstructive sleep apnea (adult) (pediatric): Secondary | ICD-10-CM | POA: Diagnosis not present

## 2021-08-25 DIAGNOSIS — E782 Mixed hyperlipidemia: Secondary | ICD-10-CM

## 2021-08-25 DIAGNOSIS — R0789 Other chest pain: Secondary | ICD-10-CM

## 2021-08-25 DIAGNOSIS — I493 Ventricular premature depolarization: Secondary | ICD-10-CM

## 2021-08-25 DIAGNOSIS — Z9989 Dependence on other enabling machines and devices: Secondary | ICD-10-CM

## 2021-08-25 MED ORDER — LOSARTAN POTASSIUM 100 MG PO TABS: 100.0000 mg | ORAL_TABLET | Freq: Every day | ORAL | 3 refills | Status: DC

## 2021-08-25 NOTE — Patient Instructions (Signed)
Medication Instructions:  No changes  If you need a refill on your cardiac medications before your next appointment, please call your pharmacy.   Lab work: No new labs needed  Testing/Procedures: No new testing needed  Follow-Up: At [facility], you and your health needs are our priority.  As part of our continuing mission to provide you with exceptional heart care, we have created designated Provider Care Teams.  These Care Teams include your primary Cardiologist (physician) and Advanced Practice Providers (APPs -  Physician Assistants and Nurse Practitioners) who all work together to provide you with the care you need, when you need it.  You will need a follow up appointment in 12 months  Providers on your designated Care Team:   Christopher Berge, NP Ryan Dunn, PA-C Cadence Furth, PA-C  COVID-19 Vaccine Information can be found at: [email or web address] For questions related to vaccine distribution or appointments, please email [email or web address] or call [phone].   

## 2021-08-26 DIAGNOSIS — M5441 Lumbago with sciatica, right side: Secondary | ICD-10-CM | POA: Diagnosis not present

## 2021-08-26 DIAGNOSIS — M5136 Other intervertebral disc degeneration, lumbar region: Secondary | ICD-10-CM | POA: Diagnosis not present

## 2021-08-26 DIAGNOSIS — M5416 Radiculopathy, lumbar region: Secondary | ICD-10-CM | POA: Diagnosis not present

## 2021-09-02 ENCOUNTER — Other Ambulatory Visit: Payer: Self-pay | Admitting: Neurosurgery

## 2021-09-02 DIAGNOSIS — M5416 Radiculopathy, lumbar region: Secondary | ICD-10-CM

## 2021-09-02 DIAGNOSIS — M51369 Other intervertebral disc degeneration, lumbar region without mention of lumbar back pain or lower extremity pain: Secondary | ICD-10-CM

## 2021-09-02 DIAGNOSIS — M5136 Other intervertebral disc degeneration, lumbar region: Secondary | ICD-10-CM

## 2021-09-02 DIAGNOSIS — M5441 Lumbago with sciatica, right side: Secondary | ICD-10-CM

## 2021-09-10 ENCOUNTER — Ambulatory Visit
Admission: RE | Admit: 2021-09-10 | Discharge: 2021-09-10 | Disposition: A | Discharge status: Home / Self Care | Payer: BC Managed Care – PPO | Source: Ambulatory Visit | Attending: Neurosurgery | Admitting: Neurosurgery

## 2021-09-10 DIAGNOSIS — M5441 Lumbago with sciatica, right side: Secondary | ICD-10-CM | POA: Diagnosis not present

## 2021-09-10 DIAGNOSIS — M5416 Radiculopathy, lumbar region: Secondary | ICD-10-CM | POA: Diagnosis not present

## 2021-09-10 DIAGNOSIS — M5136 Other intervertebral disc degeneration, lumbar region: Secondary | ICD-10-CM | POA: Diagnosis not present

## 2021-09-10 DIAGNOSIS — M4316 Spondylolisthesis, lumbar region: Secondary | ICD-10-CM | POA: Diagnosis not present

## 2021-09-30 ENCOUNTER — Telehealth: Payer: Self-pay

## 2021-09-30 DIAGNOSIS — M5416 Radiculopathy, lumbar region: Secondary | ICD-10-CM

## 2021-09-30 NOTE — Telephone Encounter (Signed)
-----   Message from Rockey Situ sent at 09/30/2021 10:00 AM EDT ----- Regarding: MRI results Contact: [phone] Patient had his MRI at [facility]. Please call with results. He does not use mychart often he would like a phone call.

## 2021-09-30 NOTE — Telephone Encounter (Signed)
Mr. Joshua Mann,  I reviewed your recent MRI which shows some moderate narrowing around your nerve on the right at L4-5 and severe narrowing around your nerve on the left at L5-S1. This certainly could be causing your leg symptoms. As we discussed at your visit we typically recommend starting with injections and PT first. Are these things you would be interested in trying? If so I am more than happy to send in a referral for both.  Please let me know if you have any questions or concerns. Duwayne Heck

## 2021-09-30 NOTE — Telephone Encounter (Signed)
MRI done on 09/10/21

## 2021-10-06 ENCOUNTER — Other Ambulatory Visit: Payer: Self-pay

## 2021-10-06 ENCOUNTER — Telehealth: Payer: Self-pay

## 2021-10-06 NOTE — Telephone Encounter (Signed)
-----   Message from Autumn Messing sent at 10/06/2021  8:49 AM EDT ----- Regarding: RE: MRI results Patient at work;spoke with his wife. She said he's been unable to access his MyChart account. I left a message for him to call us to reset his password so he can view his MRI results.   Thank you,  Victorino Dike   ----- Message ----- From: Sharlot Gowda, RN Sent: 10/05/2021   5:10 PM EDT To: Autumn Messing Subject: RE: MRI results                                Please inform him that Danielle sent him a mychart message on 7/5 with his results. Thanks! ----- Message ----- From: Autumn Messing Sent: 10/05/2021   4:54 PM EDT To: Aretha Parrot, CMA; Sharlot Gowda, RN Subject: MRI results                                    Patient called to follow up on MRI results; requesting call back with findings. Please advise at [phone].

## 2021-10-07 NOTE — Telephone Encounter (Signed)
I have spoke with the patient about his MRI results, he would like to try injections. I have placed the order.  When we get access can you please send the referral to [facility].

## 2021-10-11 NOTE — Telephone Encounter (Signed)
Referral faxed to [facility] physiatry.

## 2021-10-12 DIAGNOSIS — L03116 Cellulitis of left lower limb: Secondary | ICD-10-CM | POA: Diagnosis not present

## 2021-10-12 DIAGNOSIS — R21 Rash and other nonspecific skin eruption: Secondary | ICD-10-CM | POA: Diagnosis not present

## 2021-10-13 DIAGNOSIS — R21 Rash and other nonspecific skin eruption: Secondary | ICD-10-CM | POA: Diagnosis not present

## 2021-10-13 DIAGNOSIS — X58XXXA Exposure to other specified factors, initial encounter: Secondary | ICD-10-CM | POA: Diagnosis not present

## 2021-10-14 IMAGING — CT CT CARDIAC CORONARY ARTERY CALCIUM SCORE
3 series · 14 of 20 positions shown, 16 images · non-contrast
Comparison: None.
COMPARISON: None.

Addendum:
EXAM:
OVER-READ INTERPRETATION  CT CHEST

The following report is an over-read performed by radiologist Dr.
Ingunn Harpa Ronlor [REDACTED] on 09/01/2020. This
over-read does not include interpretation of cardiac or coronary
anatomy or pathology. The coronary calcium score interpretation by
the cardiologist is attached.
CLINICAL DATA: Risk stratification
Coronary Calcium Score
TECHNIQUE: The patient was scanned on a Siemens Somatom go.Top Scanner. Axial
non-contrast 3 mm slices were carried out through the heart. The
data set was analyzed on a dedicated work station and scored using
the Agatson method.

[Series 2: sa36 calcium scoring 3.00 · axial · 0.34mm/px · z∈[-1100,-1019]mm · 4 of 46 slices shown]
[im 10/46  vessel]
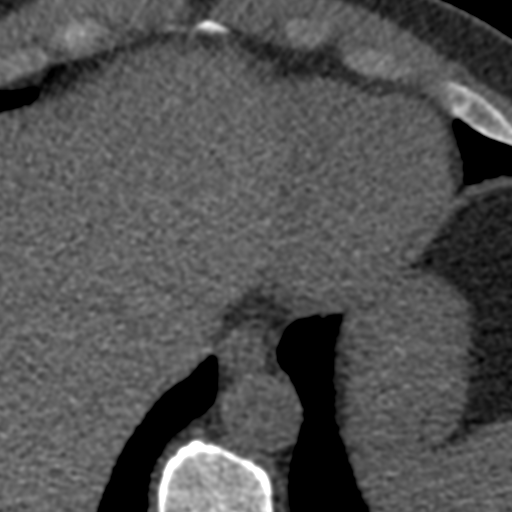
[im 19/46  vessel]
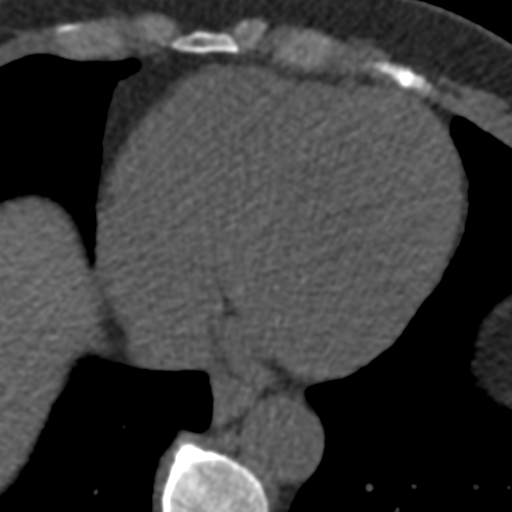
[im 28/46  vessel]
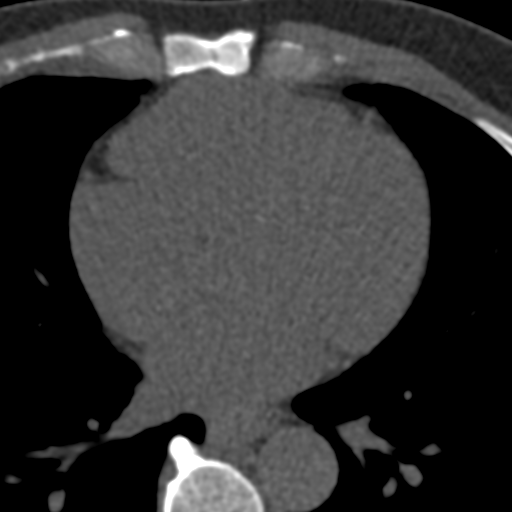
[im 37/46  vessel]
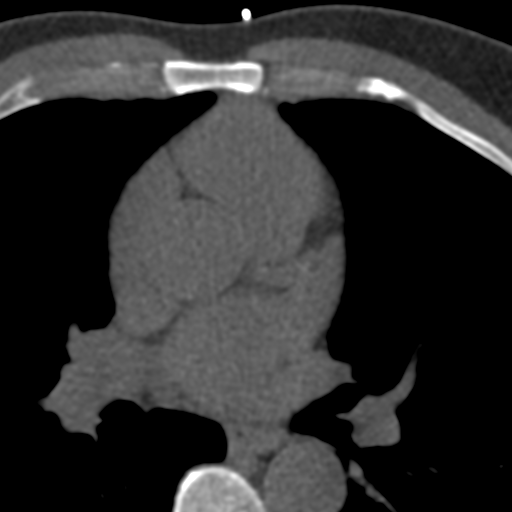

[Series 5: full fov st calcium scoring 3.00 · axial · 0.73mm/px · z∈[-1106,-1016]mm · 5 of 46 slices shown, 7 images]
[im 8/46  vessel]
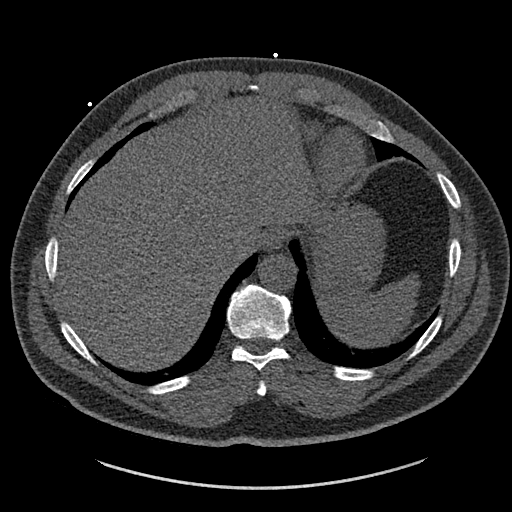
[im 8/46  lung]
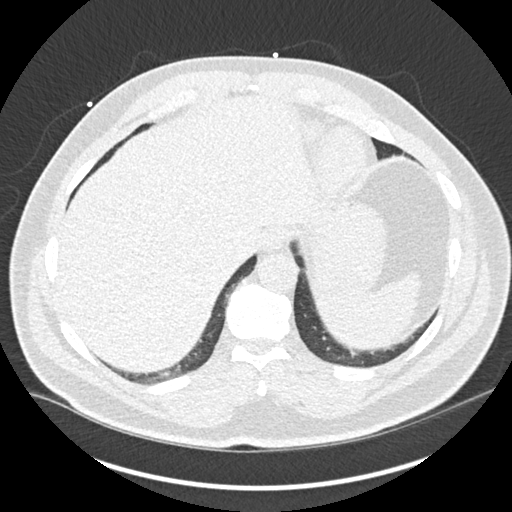
[im 16/46  vessel]
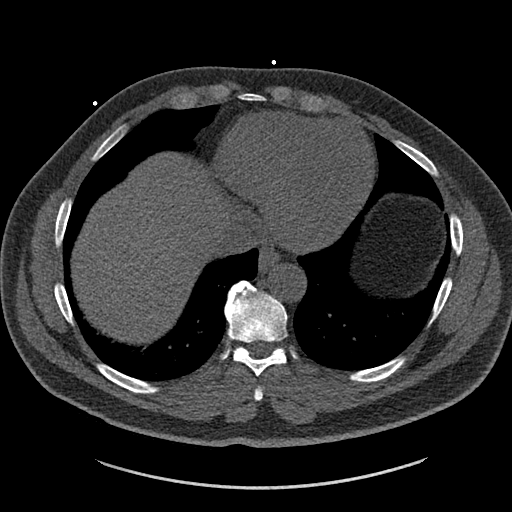
[im 23/46  vessel]
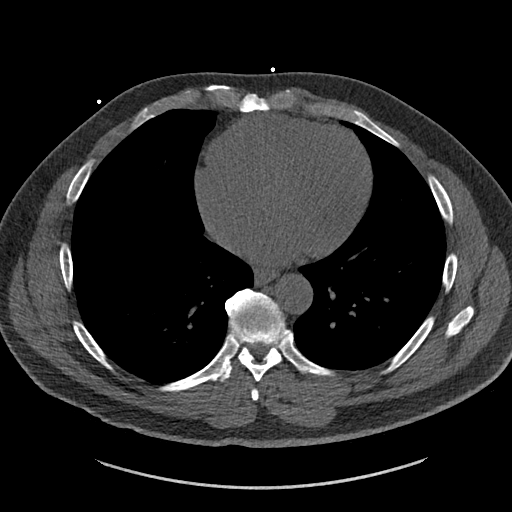
[im 31/46  vessel]
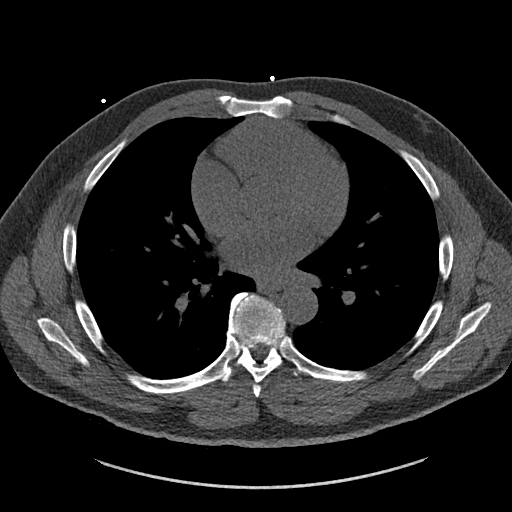
[im 38/46  vessel]
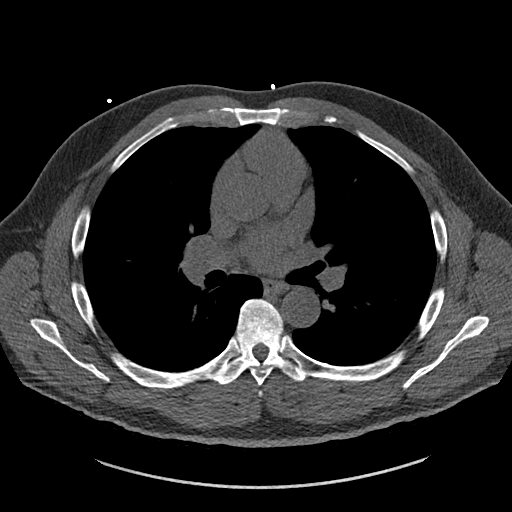
[im 38/46  lung]
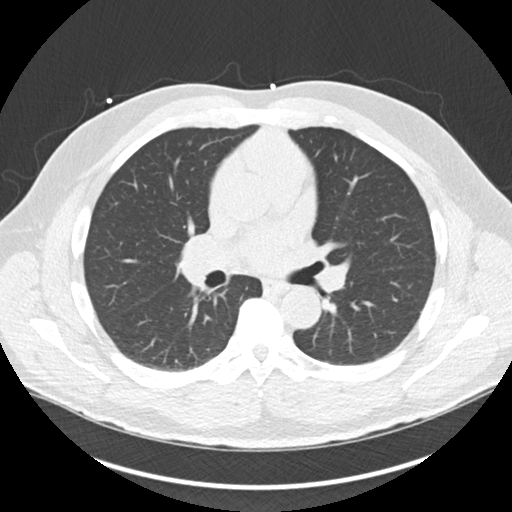

[Series 10: full fov lungs calcium scoring 3.00 ax · axial · 0.73mm/px · z∈[-1106,-1016]mm · 5 of 46 slices shown]
[im 8/46  vessel]
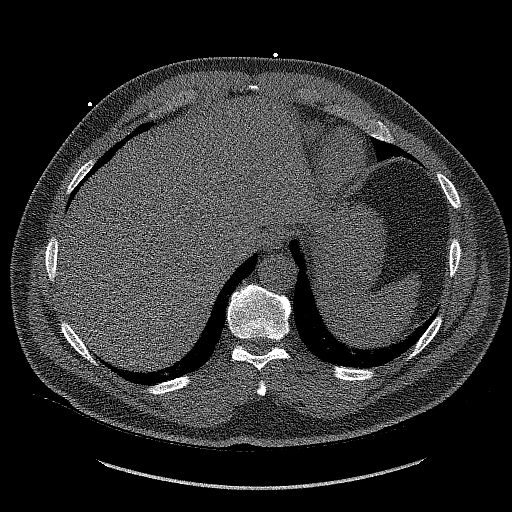
[im 16/46  vessel]
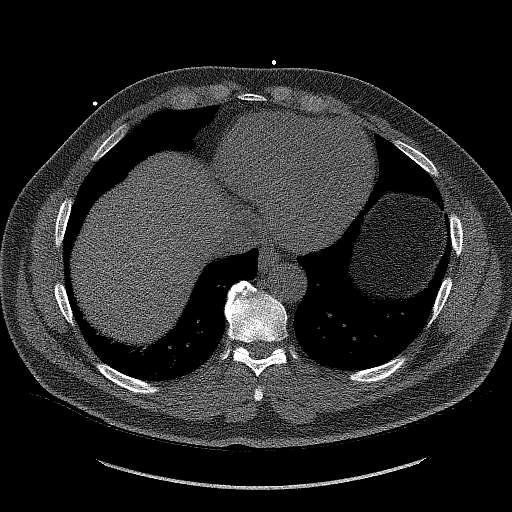
[im 23/46  vessel]
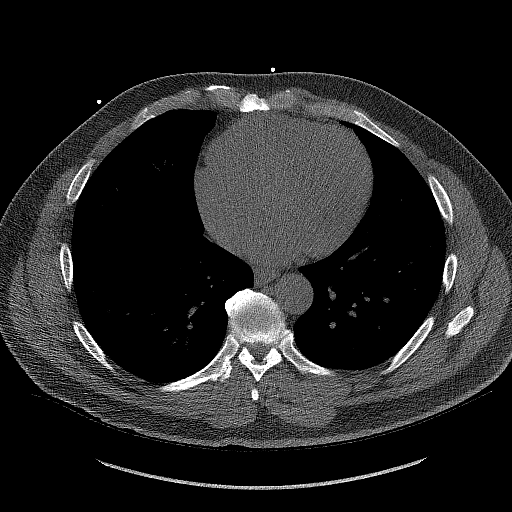
[im 31/46  vessel]
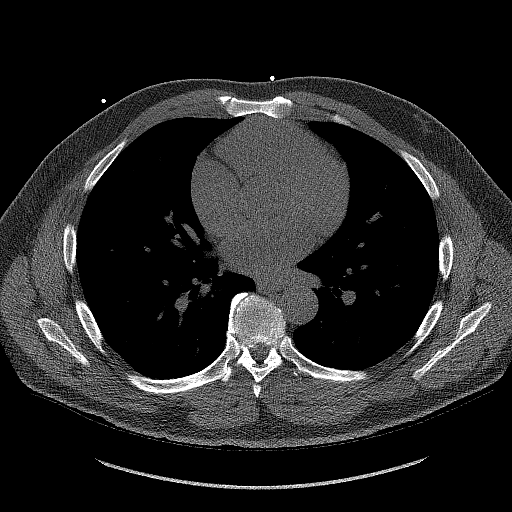
[im 38/46  vessel]
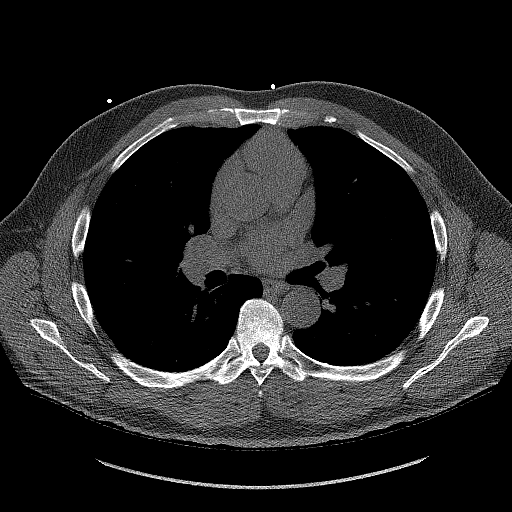

[14 of 20 positions shown; findings below may reference images not displayed]

FINDINGS: 2 mm left upper lobe pulmonary nodule (axial image 7 of series 10).
Within the visualized portions of the thorax there are no other
larger more suspicious appearing pulmonary nodules or masses, there
is no acute consolidative airspace disease, no pleural effusions, no
pneumothorax and no lymphadenopathy. Visualized portions of the
upper abdomen are unremarkable. There are no aggressive appearing
lytic or blastic lesions noted in the visualized portions of the
skeleton.
IMPRESSION: 1. 2 mm pulmonary nodule in the left upper lobe, nonspecific, but
statistically likely benign. No follow-up needed if patient is
low-risk. Non-contrast chest CT can be considered in 12 months if
patient is high-risk. This recommendation follows the consensus
statement: Guidelines for Management of Incidental Pulmonary Nodules
Detected on CT Images: From the [HOSPITAL] 3882; Radiology
FINDINGS: Non-cardiac: See separate report from [REDACTED].

Ascending Aorta: Normal size

Pericardium: Normal

Coronary arteries: Normal origin of left and right coronary
arteries. Distribution of arterial calcifications if present, as
noted below;

LM 0

LAD 0

LCx 0

RCA 0

Total 0

IMPRESSION AND RECOMMENDATION:
1. Normal coronary calcium score of 0. Patient is low risk for
coronary events.

2. CAC 0, NAZARETH JUMPER0.

3. Continue heart healthy lifestyle and risk factor modification.

Pee Lalonde

*** End of Addendum ***
EXAM:
OVER-READ INTERPRETATION  CT CHEST

The following report is an over-read performed by radiologist Dr.
Ingunn Harpa Ronlor [REDACTED] on 09/01/2020. This
over-read does not include interpretation of cardiac or coronary
anatomy or pathology. The coronary calcium score interpretation by
the cardiologist is attached.
FINDINGS: 2 mm left upper lobe pulmonary nodule (axial image 7 of series 10).
Within the visualized portions of the thorax there are no other
larger more suspicious appearing pulmonary nodules or masses, there
is no acute consolidative airspace disease, no pleural effusions, no
pneumothorax and no lymphadenopathy. Visualized portions of the
upper abdomen are unremarkable. There are no aggressive appearing
lytic or blastic lesions noted in the visualized portions of the
skeleton.
IMPRESSION: 1. 2 mm pulmonary nodule in the left upper lobe, nonspecific, but
statistically likely benign. No follow-up needed if patient is
low-risk. Non-contrast chest CT can be considered in 12 months if
patient is high-risk. This recommendation follows the consensus
statement: Guidelines for Management of Incidental Pulmonary Nodules
Detected on CT Images: From the [HOSPITAL] 3882; Radiology

## 2021-10-18 DIAGNOSIS — I10 Essential (primary) hypertension: Secondary | ICD-10-CM | POA: Diagnosis not present

## 2021-10-18 DIAGNOSIS — R21 Rash and other nonspecific skin eruption: Secondary | ICD-10-CM | POA: Diagnosis not present

## 2021-10-18 DIAGNOSIS — R609 Edema, unspecified: Secondary | ICD-10-CM | POA: Diagnosis not present

## 2021-10-29 DIAGNOSIS — M5441 Lumbago with sciatica, right side: Secondary | ICD-10-CM | POA: Diagnosis not present

## 2021-10-29 DIAGNOSIS — M5442 Lumbago with sciatica, left side: Secondary | ICD-10-CM | POA: Diagnosis not present

## 2021-10-29 DIAGNOSIS — G8929 Other chronic pain: Secondary | ICD-10-CM | POA: Diagnosis not present

## 2021-10-29 DIAGNOSIS — M48062 Spinal stenosis, lumbar region with neurogenic claudication: Secondary | ICD-10-CM | POA: Diagnosis not present

## 2021-11-25 DIAGNOSIS — M5442 Lumbago with sciatica, left side: Secondary | ICD-10-CM | POA: Diagnosis not present

## 2021-11-25 DIAGNOSIS — M48062 Spinal stenosis, lumbar region with neurogenic claudication: Secondary | ICD-10-CM | POA: Diagnosis not present

## 2021-11-25 DIAGNOSIS — M5441 Lumbago with sciatica, right side: Secondary | ICD-10-CM | POA: Diagnosis not present

## 2021-12-10 DIAGNOSIS — M5136 Other intervertebral disc degeneration, lumbar region: Secondary | ICD-10-CM | POA: Diagnosis not present

## 2021-12-10 DIAGNOSIS — M5126 Other intervertebral disc displacement, lumbar region: Secondary | ICD-10-CM | POA: Diagnosis not present

## 2021-12-10 DIAGNOSIS — M5416 Radiculopathy, lumbar region: Secondary | ICD-10-CM | POA: Diagnosis not present

## 2022-01-03 DIAGNOSIS — M5126 Other intervertebral disc displacement, lumbar region: Secondary | ICD-10-CM | POA: Diagnosis not present

## 2022-01-03 DIAGNOSIS — M5416 Radiculopathy, lumbar region: Secondary | ICD-10-CM | POA: Diagnosis not present

## 2022-02-04 DIAGNOSIS — G8929 Other chronic pain: Secondary | ICD-10-CM | POA: Diagnosis not present

## 2022-02-04 DIAGNOSIS — M5442 Lumbago with sciatica, left side: Secondary | ICD-10-CM | POA: Diagnosis not present

## 2022-02-04 DIAGNOSIS — M5441 Lumbago with sciatica, right side: Secondary | ICD-10-CM | POA: Diagnosis not present

## 2022-02-04 DIAGNOSIS — M48062 Spinal stenosis, lumbar region with neurogenic claudication: Secondary | ICD-10-CM | POA: Diagnosis not present

## 2022-02-08 NOTE — Progress Notes (Unsigned)
Date:  02/09/2022   ID:  [name], DOB [birth year], MRN [id number]  Patient Location:  [address] [place] [address]   Provider location:   [facility],  office  PCP:  Alvina Filbert, MD  Cardiologist:  [facility]   Chief Complaint  Patient presents with   Chest Pain    Patient c/o left arm pain & chest tightness for about 1 week. Medications reviewed by the patient verbally.     History of Present Illness:    Joshua Mann is a 61 y.o. male  past medical history of obstructive sleep apnea off CPAP,  PACs on Holter monitor Prior cholesterol more than 220 Chronic leg cramping unrelated to statin Normal coronary calcium score of 0. In 6/22 presents for followup of his hypertension,  high cholesterol, bradycardia.  LOV 6/23 On follow-up discussions today, He reports having chronic low back pain with sciatica Had prior back surgery Significant pathology noticed on lumbar MRI June 2023  He also reports having at least 1 week of left-sided chest pain Chest pain will present anytime, with exertion or sitting Does not feel it is musculoskeletal or ribs, feels deeper Also having pain down his left arm  Previously held statin for leg cramping Blood pressure running high in doctors office visits, has not been checking on a regular basis at home Takes amlodipine 10 daily, Cardura 8 mg in the evening, losartan 100 mg  No shortness of breath on exertion  EKG personally reviewed by myself on todays visit Shows normal sinus rhythm rate 58 bpm no significant ST-T wave changes   Past Medical History:  Diagnosis Date   Bruit 10/14/2009   Asymptomatic Bruit, Normal Carotid Doppler   Chest pain 10/14/2009   Stress Test demontrated a small fixed Inferolateral perfusion defect   Claudication (HCC) 10/14/2009   Normal Lower Arterial Doppler   Hypertension    controlled   Mixed dyslipidemia    Murmur 10/31/2008    Echo EF >55%  AV apear mildly Sclerotic, No AS   Obstructive sleep apnea    on c-pap   Past Surgical History:  Procedure Laterality Date   HEEL SPUR EXCISION     LUMBAR LAMINECTOMY/DECOMPRESSION MICRODISCECTOMY Right 06/07/2017   Procedure: LUMBAR LAMINECTOMY/DECOMPRESSION MICRODISCECTOMY 1 LEVEL-L5-S1;  Surgeon: Venetia Night, MD;  Location: [facility] ORS;  Service: Neurosurgery;  Laterality: Right;   Ulcers  2002   Stomach Bleeding     Current Meds  Medication Sig   amLODipine (NORVASC) 10 MG tablet Take 1 tablet by mouth once daily   aspirin 81 MG EC tablet Take 81 mg by mouth daily.   losartan (COZAAR) 100 MG tablet Take 1 tablet (100 mg total) by mouth daily.   pregabalin (LYRICA) 75 MG capsule Take 1 capsule (75 mg total) by mouth 2 (two) times daily.   [DISCONTINUED] doxazosin (CARDURA) 8 MG tablet Take 1 tablet by mouth once daily     Allergies:   Lisinopril   Social History   Tobacco Use   Smoking status: Former    Packs/day: 1.00    Years: 12.00    Total pack years: 12.00    Types: Cigarettes    Quit date: 03/21/1982    Years since quitting: 39.9   Smokeless tobacco: Never  Vaping Use   Vaping Use: Never used  Substance Use Topics   Alcohol use: No   Drug use: No     Current Outpatient Medications on  File Prior to Visit  Medication Sig Dispense Refill   amLODipine (NORVASC) 10 MG tablet Take 1 tablet by mouth once daily 90 tablet 3   aspirin 81 MG EC tablet Take 81 mg by mouth daily.     losartan (COZAAR) 100 MG tablet Take 1 tablet (100 mg total) by mouth daily. 90 tablet 3   pregabalin (LYRICA) 75 MG capsule Take 1 capsule (75 mg total) by mouth 2 (two) times daily. 30 capsule 0   No current facility-administered medications on file prior to visit.     Family Hx: The patient's family history includes Cancer (age of onset: 23) in his sister; Cancer (age of onset: 71) in his father; Hypertension in his mother.  ROS:   Please see the history of present  illness.    Review of Systems  Constitutional: Negative.   HENT: Negative.    Respiratory: Negative.    Cardiovascular:  Positive for chest pain.  Gastrointestinal: Negative.   Musculoskeletal: Negative.        Cramps in the legs  Neurological: Negative.   Psychiatric/Behavioral: Negative.    All other systems reviewed and are negative.    Labs/Other Tests and Data Reviewed:    Recent Labs: 02/09/2022: BUN 13; Creatinine, Ser 1.07; Potassium 4.3; Sodium 141   Recent Lipid Panel Lab Results  Component Value Date/Time   CHOL 170 11/13/2015 10:14 AM   TRIG 285 (H) 11/13/2015 10:14 AM   HDL 33 (L) 11/13/2015 10:14 AM   CHOLHDL 5.2 (H) 11/13/2015 10:14 AM   LDLCALC 80 11/13/2015 10:14 AM    Wt Readings from Last 3 Encounters:  02/09/22 231 lb 2 oz (104.8 kg)  08/25/21 228 lb 8 oz (103.6 kg)  08/04/20 216 lb 2 oz (98 kg)     Exam:    Vital Signs: Vital signs may also be detailed in the HPI BP (!) 160/72 (BP Location: Left Arm, Patient Position: Sitting, Cuff Size: Large)   Pulse (!) 58   Ht 6\' 1"  (1.854 m)   Wt 231 lb 2 oz (104.8 kg)   SpO2 95%   BMI 30.49 kg/m   Constitutional:  oriented to person, place, and time. No distress.  HENT:  Head: Grossly normal Eyes:  no discharge. No scleral icterus.  Neck: No JVD, no carotid bruits  Cardiovascular: Regular rate and rhythm, no murmurs appreciated Pulmonary/Chest: Clear to auscultation bilaterally, no wheezes or rails Abdominal: Soft.  no distension.  no tenderness.  Musculoskeletal: Normal range of motion Neurological:  normal muscle tone. Coordination normal. No atrophy Skin: Skin warm and dry Psychiatric: normal affect, pleasant  ASSESSMENT & PLAN:    Essential hypertension Recommended he stay on current medications with the addition of extra Cardura 4 mg in the morning, Cardura 8 mg in the p.m., continue losartan and amlodipine  Chest pain He is concerned about chest pain as symptoms of angina Chest pain arm  pain over the past week or so We will order cardiac CTA Likely low risk of underlying ischemia given calcium score of 0  Chronic leg cramps Statin previously held  Mixed hyperlipidemia Management limited by cramping For significant coronary disease on CT scan, may need Zetia or PCSK9 inhibitor  Bradycardia Asymptomatic  Chest tightness Low calcium score, recent symptoms, additional testing ordered as above  OSA off CPAP no longer uses his CPAP  Palpitations No significant tachypalpitations    Total encounter time more than 30 minutes  Greater than 50% was spent in counseling and coordination  of care with the patient   Signed, Ida Rogue, MD  02/09/2022 5:03 PM    [place] [facility] [address], [place], [place] [address]

## 2022-02-09 ENCOUNTER — Ambulatory Visit: Payer: BC Managed Care – PPO | Attending: Cardiovascular Disease | Admitting: Cardiovascular Disease

## 2022-02-09 ENCOUNTER — Other Ambulatory Visit
Admission: RE | Admit: 2022-02-09 | Discharge: 2022-02-09 | Disposition: A | Discharge status: Home / Self Care | Payer: BC Managed Care – PPO | Source: Ambulatory Visit | Attending: Cardiovascular Disease | Admitting: Cardiovascular Disease

## 2022-02-09 ENCOUNTER — Encounter: Payer: Self-pay | Admitting: Cardiovascular Disease

## 2022-02-09 VITALS — BP 160/72 | HR 58 | Ht 73.0 in | Wt 231.1 lb

## 2022-02-09 DIAGNOSIS — I493 Ventricular premature depolarization: Secondary | ICD-10-CM | POA: Diagnosis not present

## 2022-02-09 DIAGNOSIS — R072 Precordial pain: Secondary | ICD-10-CM | POA: Insufficient documentation

## 2022-02-09 DIAGNOSIS — R001 Bradycardia, unspecified: Secondary | ICD-10-CM

## 2022-02-09 DIAGNOSIS — I10 Essential (primary) hypertension: Secondary | ICD-10-CM

## 2022-02-09 DIAGNOSIS — G4733 Obstructive sleep apnea (adult) (pediatric): Secondary | ICD-10-CM | POA: Diagnosis not present

## 2022-02-09 DIAGNOSIS — E782 Mixed hyperlipidemia: Secondary | ICD-10-CM

## 2022-02-09 DIAGNOSIS — R0789 Other chest pain: Secondary | ICD-10-CM

## 2022-02-09 LAB — BASIC METABOLIC PANEL
Anion gap: 7 (ref 5–15)
BUN: 13 mg/dL (ref 8–23)
CO2: 25 mmol/L (ref 22–32)
Calcium: 9 mg/dL (ref 8.9–10.3)
Chloride: 109 mmol/L (ref 98–111)
Creatinine, Ser: 1.07 mg/dL (ref 0.61–1.24)
GFR, Estimated: >60 mL/min (ref >60)
Glucose, Bld: 106 mg/dL — ABNORMAL HIGH (ref 70–99)
Potassium: 4.3 mmol/L (ref 3.5–5.1)
Sodium: 141 mmol/L (ref 135–145)

## 2022-02-09 MED ORDER — DOXAZOSIN MESYLATE 4 MG PO TABS: ORAL_TABLET | ORAL | 1 refills | Status: DC

## 2022-02-09 NOTE — Patient Instructions (Addendum)
Medication Instructions:   Add additional cardura/doxazosin 4 mg in the morning,  Continue 8 mg in the PM Monitor blood pressure  If you need a refill on your cardiac medications before your next appointment, please call your pharmacy.   Lab work:  Please go to [facility] after your appointment today for a BMP lab draw.  Testing/Procedures:  Cardiac CTA for angina , chest pain  Your physician has requested that you have cardiac CT. Cardiac computed tomography (CT) is a painless test that uses an x-ray machine to take clear, detailed pictures of your heart.    Your cardiac CT will be scheduled at:  [facility] [address] [place], [place] [address] [phone]  OR   [facility]) [address] [place], [place] [address] [phone]  Please arrive 15 mins early for check-in and test prep.    Please follow these instructions carefully (unless otherwise directed):   Hold all erectile dysfunction medications at least 3 days (72 hrs) prior to test. (Ie viagra, cialis, sildenafil, tadalafil, etc) We will administer nitroglycerin during this exam.    Night Before the Test: Be sure to Drink plenty of water. Do not consume any caffeinated/decaffeinated beverages or chocolate 12 hours prior to your test.    On the Day of the Test: Drink plenty of water until 1 hour prior to the test. Do not eat any food 4 hours prior to the test. You may take your regular medications prior to the test.         After the Test: Drink plenty of water. After receiving IV contrast, you may experience a mild flushed feeling. This is normal. On occasion, you may experience a mild rash up to 24 hours after the test. This is not dangerous. If this occurs, you can take Benadryl 25 mg and increase your fluid intake. If you experience trouble breathing, this can be serious. If it is severe call 911  IMMEDIATELY. If it is mild, please call our office. If you take any of these medications: Glipizide/Metformin, Avandament, Glucavance, please do not take 48 hours after completing test unless otherwise instructed.  Please allow 2-4 weeks for scheduling of routine cardiac CTs. Some insurance companies require a pre-authorization which may delay scheduling of this test.   For non-scheduling related questions, please contact the cardiac imaging nurse navigator should you have any questions/concerns: Rockwell [place], Cardiac Imaging Nurse Navigator Larey Brick, Cardiac Imaging Nurse Navigator [facility] Direct Office Dial: [phone]   For scheduling needs, including cancellations and rescheduling, please call [place], [phone].    Follow-Up: At [facility], you and your health needs are our priority.  As part of our continuing mission to provide you with exceptional heart care, we have created designated Provider Care Teams.  These Care Teams include your primary Cardiologist (physician) and Advanced Practice Providers (APPs -  Physician Assistants and Nurse Practitioners) who all work together to provide you with the care you need, when you need it.  You will need a follow up appointment in 12 months  Providers on your designated Care Team:   Nicolasa Ducking, NP Eula Listen, PA-C Cadence Fransico Stefanos, [place]  COVID-19 Vaccine Information can be found at: [email or web address] For questions related to vaccine distribution or appointments, please email [email or web address] or call [phone].

## 2022-02-23 ENCOUNTER — Telehealth (HOSPITAL_COMMUNITY): Payer: Self-pay | Admitting: *Deleted

## 2022-02-23 NOTE — Telephone Encounter (Signed)
Attempted to call patient regarding upcoming cardiac CT appointment. °Left message on voicemail with name and callback number ° °Rosemae Mcquown RN Navigator Cardiac Imaging °[facility] °[phone] Office °[phone] Cell ° °

## 2022-02-24 ENCOUNTER — Ambulatory Visit
Admission: RE | Admit: 2022-02-24 | Discharge: 2022-02-24 | Disposition: A | Discharge status: Home / Self Care | Payer: BC Managed Care – PPO | Source: Ambulatory Visit | Attending: Cardiovascular Disease | Admitting: Cardiovascular Disease

## 2022-02-24 DIAGNOSIS — R072 Precordial pain: Secondary | ICD-10-CM | POA: Insufficient documentation

## 2022-02-24 MED ORDER — NITROGLYCERIN 0.4 MG SL SUBL
0.8000 mg | SUBLINGUAL_TABLET | Freq: Once | SUBLINGUAL | Status: AC
  Administered 2022-02-24: 0.8 mg via SUBLINGUAL
  Filled 2022-02-24: qty 25

## 2022-02-24 MED ORDER — METOPROLOL TARTRATE 5 MG/5ML IV SOLN
10.0000 mg | Freq: Once | INTRAVENOUS | Status: AC
  Administered 2022-02-24: 10 mg via INTRAVENOUS
  Filled 2022-02-24: qty 10

## 2022-02-24 MED ORDER — METOPROLOL TARTRATE 5 MG/5ML IV SOLN
INTRAVENOUS | Status: AC
  Filled 2022-02-24: qty 5

## 2022-02-24 MED ORDER — NITROGLYCERIN 0.4 MG SL SUBL
SUBLINGUAL_TABLET | SUBLINGUAL | Status: AC
  Filled 2022-02-24: qty 2

## 2022-02-24 MED ORDER — IOHEXOL 350 MG/ML SOLN
100.0000 mL | Freq: Once | INTRAVENOUS | Status: AC | PRN
  Administered 2022-02-24: 100 mL via INTRAVENOUS

## 2022-02-24 NOTE — Progress Notes (Signed)
Patient tolerated procedure well. Ambulate w/o difficulty. Denies light headedness or being dizzy. Sitting in chair drinking water provided. Encouraged to drink extra water today and reasoning explained. Verbalized understanding. All questions answered. ABC intact. No further needs. Discharge from procedure area w/o issues.   °

## 2022-03-03 ENCOUNTER — Ambulatory Visit: Admission: RE | Admit: 2022-03-03 | Payer: BC Managed Care – PPO | Source: Ambulatory Visit

## 2022-03-08 NOTE — Therapy (Unsigned)
OUTPATIENT PHYSICAL THERAPY THORACOLUMBAR EVALUATION   Patient Name: Joshua Mann MRN: [id number] DOB:December 28, 1960, 61 y.o., male Today's Date: 03/09/2022  END OF SESSION:  PT End of Session - 03/09/22 1639     Visit Number 1    Number of Visits 13    Date for PT Re-Evaluation 04/20/22    Authorization Type BCBS    Authorization - Visit Number 1    Authorization - Number of Visits 30    Progress Note Due on Visit 10    PT Start Time 1520    PT Stop Time 1605    PT Time Calculation (min) 45 min    Activity Tolerance Patient tolerated treatment well    Behavior During Therapy [facility] for tasks assessed/performed             Past Medical History:  Diagnosis Date   Bruit 10/14/2009   Asymptomatic Bruit, Normal Carotid Doppler   Chest pain 10/14/2009   Stress Test demontrated a small fixed Inferolateral perfusion defect   Claudication (HCC) 10/14/2009   Normal Lower Arterial Doppler   Hypertension    controlled   Mixed dyslipidemia    Murmur 10/31/2008   Echo EF >55%  AV apear mildly Sclerotic, No AS   Obstructive sleep apnea    on c-pap   Past Surgical History:  Procedure Laterality Date   HEEL SPUR EXCISION     LUMBAR LAMINECTOMY/DECOMPRESSION MICRODISCECTOMY Right 06/07/2017   Procedure: LUMBAR LAMINECTOMY/DECOMPRESSION MICRODISCECTOMY 1 LEVEL-L5-S1;  Surgeon: Venetia Night, MD;  Location: [facility] ORS;  Service: Neurosurgery;  Laterality: Right;   Ulcers  2002   Stomach Bleeding   Patient Active Problem List   Diagnosis Date Noted   OSA on CPAP 02/19/2017   Bradycardia 05/13/2015   Chest tightness 09/06/2013   Essential hypertension 08/03/2012   Palpitations 08/03/2012   Mixed hyperlipidemia 08/03/2012    PCP: Alvina Filbert  REFERRING PROVIDER:   Filomena Jungling I, MD none none    REFERRING DIAG:  Diagnosis Description  Pt Eval And Tx For Chronic Bilateral Low Back Pain with bilateral-Per Filomena Jungling, MD sciatica(M54.42,M54.41, F16.38  Lumbar Stenosis With Neurogenic Claudication (M48.062) Back And Bilateral Leg Pain    Rationale for Evaluation and Treatment: Rehabilitation  THERAPY DIAG:  Lumber radiculopathy Muscle weakness  ONSET DATE: Pt states several months  SUBJECTIVE:                                                                                                                                                                                           SUBJECTIVE STATEMENT: Pt states that he has had surgery on his back in 2019.  His back did fine until recently.  He has had a couple of injections but after a day the pain comes back.  His back bothers him first thing in the morning and at night.  He wakes up about twice a night.  He states that standing is the worst bothering him after 10 minutes.  He can sit for about 30 minutes.   PERTINENT HISTORY:  See above, previous back surgery   PAIN:  Are you having pain? Yes: NPRS scale: 6/10, worst pain is an 8, best 0 Pain location: Rt lower back , radiates into his buttock ; both his feet are constantly numb.  Pain description: aching with occasional sharp Aggravating factors: standing  Relieving factors: sit down   PRECAUTIONS: None  WEIGHT BEARING RESTRICTIONS: No  FALLS:  Has patient fallen in last 6 months? No  LIVING ENVIRONMENT: Lives with: lives with their family Lives in: House/apartment Stairs: Yes: External: 3 steps; on right going up  sometimes it feels like his knee is going to give out going up the steps  Has following equipment at home: None  OCCUPATION: sit and stand  PLOF: Independent  PATIENT GOALS: less pain   NEXT MD VISIT: not scheduled   OBJECTIVE:   DIAGNOSTIC FINDINGS:  IMPRESSION: 1. Prior right hemi laminectomy with micro discectomy at L5-S1 without significant residual canal or lateral recess stenosis. 2. Left foraminal to extraforaminal disc protrusion with prominent reactive endplate spurring at L5-S1, resulting in  severe left L5 foraminal stenosis. 3. Disc bulge with small right foraminal disc protrusion at L4-5 with resultant mild to moderate right L4 foraminal stenosis. 4. Disc bulging with facet hypertrophy at L3-4 with resultant mild to moderate right worse than left L5 foraminal stenosis.     Electronically Signed   By: Rise MuBenjamin  McClintock M.D.   On: 09/11/2021 06:45  PATIENT SURVEYS:  FOTO 57     MUSCLE LENGTH: Hamstrings: Right 145 deg; Left 145 deg POSTURE: rounded shoulders and decreased lumbar lordosis  PALPATION: Tight paraspinal mm  LUMBAR ROM:   AROM eval  Flexion Fingers to mid shin with increased pain upon pulling up   Extension 20 reps increases pain  Right lateral flexion   Left lateral flexion   Right rotation   Left rotation    (Blank rows = not tested)   LOWER EXTREMITY MMT:    MMT Right eval Left eval  Hip flexion 5 4  Hip extension 4- 5  Hip abduction 5 4+  Hip adduction    Hip internal rotation    Hip external rotation    Knee flexion 5 5  Knee extension 4- 4+  Ankle dorsiflexion 5 5  Ankle plantarflexion    Ankle inversion    Ankle eversion     (Blank rows = not tested)    FUNCTIONAL TESTS:  30 seconds chair stand test; 5   2 minute walk test: 404 ft with pain down Rt side of his leg.      TODAY'S TREATMENT:  DATE: 03/09/22  Sitting: LAQ RT x 10              Ab set x 10              Scapular retraction x 10  Supine: bridge x 10              Active hamstring stretch x 3 for 30" each   PATIENT EDUCATION:  Education details: HEP Person educated: Patient Education method: Explanation Education comprehension: verbalized understanding and returned demonstration  HOME EXERCISE PROGRAM: Access Code: [email or web address] URL: [email or web address] Date: 03/09/2022 Prepared by: Virgina Organ  Exercises - Seated Long Arc Quad  - 2 x daily - 7 x weekly - 1 sets - 10 reps - 5" hold - Seated Scapular Retraction  - 2 x daily - 7 x weekly - 1 sets - 10 reps - 5" hold - Abdominal Bracing  - 4 x daily - 7 x weekly - 1 sets - 10 reps - 5" hold - Supine Hamstring Stretch  - 2 x daily - 7 x weekly - 1 sets - 3 reps - 30" hold - Supine Bridge  - 2 x daily - 7 x weekly - 1 sets - 10 reps - 5" hold  ASSESSMENT:  CLINICAL IMPRESSION: Patient is a 61 y.o. male who was seen today for physical therapy evaluation and treatment for lumbar radiculopathy.  Evaluation demonstrates decreased ROM, decreased strength, decreased activity tolerance as well as increased pain.  Mr. Viglione will benefit from skilled PT to address these issues.   OBJECTIVE IMPAIRMENTS: decreased activity tolerance, difficulty walking, decreased ROM, decreased strength, hypomobility, impaired flexibility, improper body mechanics, and pain.   ACTIVITY LIMITATIONS: carrying, lifting, standing, stairs, and locomotion level  PARTICIPATION LIMITATIONS: community activity, occupation, and yard work  PERSONAL FACTORS: Past/current experiences are also affecting patient's functional outcome.   REHAB POTENTIAL: Good  CLINICAL DECISION MAKING: Evolving/moderate complexity  EVALUATION COMPLEXITY: Moderate   GOALS: Goals reviewed with patient? No  SHORT TERM GOALS: Target date: 03/30/22  Pt to be I in HEP in order to decrease his pain to no greater than a 5/10 Baseline: Goal status: INITIAL  2.  PT LE strength to be increased 1/2 grade to be able to rise out of a couch without UE with ease.  Baseline:  Goal status: INITIAL  3.  PT to be able to stand for 15 minutes without increased sx  Baseline:  Goal status: INITIAL  4.  PT to demonstrate good bed mobility body mechanics  Baseline:  Goal status: INITIAL    LONG TERM GOALS: Target date: 04/20/2022  Pt to be I in HEP in order to decrease his pain to no  greater than a 3/10 Baseline:  Goal status: INITIAL  2.  PT LE strength to be increased 1 grade to be able to rise from a crouched position to pick items off the floor.  Baseline:  Goal status: INITIAL  3.  PT to be able to stand for 30 minutes without increased sx  Baseline:  Goal status: INITIAL  4.  PT to demonstrate good  body mechanics for lifting  Baseline:  Goal status: INITIAL  5.  PT to no longer have radicular sx.  Baseline:  Goal status: INITIAL  PLAN:  PT FREQUENCY: 2x/week  PT DURATION: 6 weeks  PLANNED INTERVENTIONS: Therapeutic exercises, Therapeutic activity, Balance training, Patient/Family education, Self Care, and Manual therapy.  PLAN FOR NEXT SESSION: begin stabilization program to include bent knee  lift, clam, heel raise, functional squat.....  Virgina Organ, PT CLT [phone]  03/09/2022, 4:40 PM

## 2022-03-09 ENCOUNTER — Ambulatory Visit (HOSPITAL_COMMUNITY): Payer: BC Managed Care – PPO | Attending: Physical Medicine & Rehabilitation | Admitting: Physical Therapy

## 2022-03-09 DIAGNOSIS — M5416 Radiculopathy, lumbar region: Secondary | ICD-10-CM | POA: Insufficient documentation

## 2022-03-09 DIAGNOSIS — M6281 Muscle weakness (generalized): Secondary | ICD-10-CM | POA: Insufficient documentation

## 2022-03-18 ENCOUNTER — Ambulatory Visit (HOSPITAL_COMMUNITY): Payer: BC Managed Care – PPO | Admitting: Physical Therapy

## 2022-03-18 DIAGNOSIS — M6281 Muscle weakness (generalized): Secondary | ICD-10-CM

## 2022-03-18 DIAGNOSIS — M5416 Radiculopathy, lumbar region: Secondary | ICD-10-CM

## 2022-03-18 NOTE — Therapy (Signed)
OUTPATIENT PHYSICAL THERAPY THORACOLUMBAR Treatment   Patient Name: Carolin GuernseyMichael N Sokolov MRN: [id number] DOB:01/03/1961, 61 y.o., male Today's Date: 03/18/2022  END OF SESSION:  PT End of Session - 03/18/22 1645     Visit Number 2    Number of Visits 13    Date for PT Re-Evaluation 04/20/22    Authorization Type BC[employer]    Authorization - Visit Number 2    Authorization - Number of Visits 30    Progress Note Due on Visit 10    PT Start Time 1605    PT Stop Time 1645    PT Time Calculation (min) 40 min    Activity Tolerance Patient tolerated treatment well    [facility] tasks assessed/performed             Past Medical History:  Diagnosis Date   Bruit 10/14/2009   Asymptomatic Bruit, Normal Carotid Doppler   Chest pain 10/14/2009   Stress Test demontrated a small fixed Inferolateral perfusion defect   Claudication (HCC) 10/14/2009   Normal Lower Arterial Doppler   Hypertension    controlled   Mixed dyslipidemia    Murmur 10/31/2008   Echo EF >55%  AV apear mildly Sclerotic, No AS   Obstructive sleep apnea    on c-pap   Past Surgical History:  Procedure Laterality Date   HEEL SPUR EXCISION     LUMBAR LAMINECTOMY/DECOMPRESSION MICRODISCECTOMY Right 06/07/2017   Procedure: LUMBAR LAMINECTOMY/DECOMPRESSION MICRODISCECTOMY 1 LEVEL-L5-S1;  Surgeon: Venetia NightYarbrough, [place], MD;  Location: [facility] ORS;  Service: Neurosurgery;  Laterality: Right;   Ulcers  2002   Stomach Bleeding   Patient Active Problem List   Diagnosis Date Noted   OSA on CPAP 02/19/2017   Bradycardia 05/13/2015   Chest tightness 09/06/2013   Essential hypertension 08/03/2012   Palpitations 08/03/2012   Mixed hyperlipidemia 08/03/2012    PCP: Alvina Filbertenise Hunter  REFERRING PROVIDER:   Filomena JunglingMorales, Jennifer I, MD none none    REFERRING DIAG:  Diagnosis Description  Pt Eval And Tx For Chronic Bilateral Low Back Pain with bilateral-Per Filomena JunglingJennifer Morales, MD sciatica(M54.42,M54.41, N82.95G89.29  Lumbar Stenosis With Neurogenic Claudication (M48.062) Back And Bilateral Leg Pain    Rationale for Evaluation and Treatment: Rehabilitation  THERAPY DIAG:  Lumber radiculopathy Muscle weakness  ONSET DATE: Pt states several months  SUBJECTIVE:                                                                                                                                                                                           SUBJECTIVE STATEMENT: Pt states that he has been doing some of the exercises and thinks  he might be a little better.  PERTINENT HISTORY:  See above, previous back surgery   PAIN:  Are you having pain? Yes: NPRS scale: 6/10, worst pain is an 8, best 0 Pain location: Rt lower back , radiates into his buttock ; both his feet are constantly numb.  Pain description: aching with occasional sharp Aggravating factors: standing  Relieving factors: sit down   PRECAUTIONS: None  WEIGHT BEARING RESTRICTIONS: No  FALLS:  Has patient fallen in last 6 months? No  LIVING ENVIRONMENT: Lives with: lives with their family Lives in: House/apartment Stairs: Yes: External: 3 steps; on right going up  sometimes it feels like his knee is going to give out going up the steps  Has following equipment at home: None  OCCUPATION: sit and stand  PLOF: Independent  PATIENT GOALS: less pain   NEXT MD VISIT: not scheduled   OBJECTIVE:   DIAGNOSTIC FINDINGS:  IMPRESSION: 1. Prior right hemi laminectomy with micro discectomy at L5-S1 without significant residual canal or lateral recess stenosis. 2. Left foraminal to extraforaminal disc protrusion with prominent reactive endplate spurring at L5-S1, resulting in severe left L5 foraminal stenosis. 3. Disc bulge with small right foraminal disc protrusion at L4-5 with resultant mild to moderate right L4 foraminal stenosis. 4. Disc bulging with facet hypertrophy at L3-4 with resultant mild to moderate right worse than left L5  foraminal stenosis.     Electronically Signed   By: Rise Mu M.D.   On: 09/11/2021 06:45  PATIENT SURVEYS:  FOTO 57     MUSCLE LENGTH: Hamstrings: Right 145 deg; Left 145 deg POSTURE: rounded shoulders and decreased lumbar lordosis  PALPATION: Tight paraspinal mm  LUMBAR ROM:   AROM eval  Flexion Fingers to mid shin with increased pain upon pulling up   Extension 20 reps increases pain  Right lateral flexion   Left lateral flexion   Right rotation   Left rotation    (Blank rows = not tested)   LOWER EXTREMITY MMT:    MMT Right eval Left eval  Hip flexion 5 4  Hip extension 4- 5  Hip abduction 5 4+  Hip adduction    Hip internal rotation    Hip external rotation    Knee flexion 5 5  Knee extension 4- 4+  Ankle dorsiflexion 5 5  Ankle plantarflexion    Ankle inversion    Ankle eversion     (Blank rows = not tested)    FUNCTIONAL TESTS:  30 seconds chair stand test; 5   2 minute walk test: 404 ft with pain down Rt side of his leg.      TODAY'S TREATMENT:                                                                                                                             DATE:  03/18/22: Standing: Heel raises x 10  Functional squat x 10 Hip excursion x 3 Back extension: pain-free  range x 10  Sitting:  Sit to stand x 10  Supine:  Knee to chest B x 30" x 3 Active hamstring stretch 3 x 30" Piriformis stretch (figure 4) Bridge x 10  Bent knee raise x 10 Side lying clam x 10  Leg press 4 pl x 10  03/09/22  Sitting: LAQ RT x 10              Ab set x 10              Scapular retraction x 10  Supine: bridge x 10              Active hamstring stretch x 3 for 30" each   PATIENT EDUCATION:  Education details: HEP Person educated: Patient Education method: Explanation Education comprehension: verbalized understanding and returned demonstration  HOME EXERCISE PROGRAM:             12/29: Access Code: [email or web address] Exercises -  Heel Raises with Counter Support  - 1 x daily - 7 x weekly - 1 sets - 10 reps - 3-5" hold - Mini Squat  - 1 x daily - 7 x weekly - 1 sets - 10 reps - 3-5" hold - Hooklying Small March  - 1 x daily - 7 x weekly - 1 sets - 10 reps - 3-5" hold - Supine Single Knee to Chest Stretch  - 1 x daily - 7 x weekly - 1 sets - 3 reps - 30" hold Access Code: [email or web address] URL: [email or web address] Date: 03/09/2022 Prepared by: Virgina Organ  Exercises - Seated Long Arc Quad  - 2 x daily - 7 x weekly - 1 sets - 10 reps - 5" hold - Seated Scapular Retraction  - 2 x daily - 7 x weekly - 1 sets - 10 reps - 5" hold - Abdominal Bracing  - 4 x daily - 7 x weekly - 1 sets - 10 reps - 5" hold - Supine Hamstring Stretch  - 2 x daily - 7 x weekly - 1 sets - 3 reps - 30" hold - Supine Bridge  - 2 x daily - 7 x weekly - 1 sets - 10 reps - 5" hold  ASSESSMENT:  CLINICAL IMPRESSION: Pt evaluation and goals reviewed with pt.  Began pt on a lumbar stabilization program as well as adding stretching.   Mr. Eckrich will continue to benefit from skilled PT to address is pain, stiffness and weakness.  OBJECTIVE IMPAIRMENTS: decreased activity tolerance, difficulty walking, decreased ROM, decreased strength, hypomobility, impaired flexibility, improper body mechanics, and pain.   ACTIVITY LIMITATIONS: carrying, lifting, standing, stairs, and locomotion level  PARTICIPATION LIMITATIONS: community activity, occupation, and yard work  PERSONAL FACTORS: Past/current experiences are also affecting patient's functional outcome.   REHAB POTENTIAL: Good  CLINICAL DECISION MAKING: Evolving/moderate complexity  EVALUATION COMPLEXITY: Moderate   GOALS: Goals reviewed with patient? No  SHORT TERM GOALS: Target date: 03/30/22  Pt to be I in HEP in order to decrease his pain to no greater than a 5/10 Baseline: Goal status: IN PROGRESS  2.  PT LE strength to be increased 1/2 grade to be able to rise out of a  couch without UE with ease.  Baseline:  Goal status: IN PROGRESS  3.  PT to be able to stand for 15 minutes without increased sx  Baseline:  Goal status: IN PROGRESS  4.  PT to demonstrate good bed mobility body mechanics  Baseline:  Goal status: IN PROGRESS  LONG TERM GOALS: Target date: 04/20/2022  Pt to be I in HEP in order to decrease his pain to no greater than a 3/10 Baseline:  Goal status: IN PROGRESS  2.  PT LE strength to be increased 1 grade to be able to rise from a crouched position to pick items off the floor.  Baseline:  Goal status: IN PROGRESS  3.  PT to be able to stand for 30 minutes without increased sx  Baseline:  Goal status: IN PROGRESS  4.  PT to demonstrate good  body mechanics for lifting  Baseline:  Goal status: IN PROGRESS  5.  PT to no longer have radicular sx.  Baseline:  Goal status: IN PROGRESS  PLAN:  PT FREQUENCY: 2x/week  PT DURATION: 6 weeks  PLANNED INTERVENTIONS: Therapeutic exercises, Therapeutic activity, Balance training, Patient/Family education, Self Care, and Manual therapy.  PLAN FOR NEXT SESSION: begin stabilization program to include bent knee lift, clam, heel raise, functional squat.....  Virgina Organ, PT CLT [phone]  03/18/2022, 4:45 PM

## 2022-03-23 ENCOUNTER — Ambulatory Visit (HOSPITAL_COMMUNITY): Payer: BC Managed Care – PPO | Attending: Physical Medicine & Rehabilitation | Admitting: Physical Therapy

## 2022-03-23 DIAGNOSIS — M5416 Radiculopathy, lumbar region: Secondary | ICD-10-CM

## 2022-03-23 DIAGNOSIS — M6281 Muscle weakness (generalized): Secondary | ICD-10-CM

## 2022-03-23 NOTE — Therapy (Signed)
OUTPATIENT PHYSICAL THERAPY THORACOLUMBAR Treatment   Patient Name: Joshua Mann MRN: [id number] DOB:January 25, 1961, 62 y.o., male Today's Date: 03/23/2022   END OF SESSION:   PT End of Session - 03/23/22 1652     Visit Number 3    Number of Visits 13    Date for PT Re-Evaluation 04/20/22    Authorization Type BCBS    Authorization - Number of Visits 30    Progress Note Due on Visit 10    PT Start Time 1950    PT Stop Time 1728    PT Time Calculation (min) 38 min    Activity Tolerance Patient tolerated treatment well    Behavior During Therapy [facility] for tasks assessed/performed                   Past Medical History:  Diagnosis Date   Bruit 10/14/2009   Asymptomatic Bruit, Normal Carotid Doppler   Chest pain 10/14/2009   Stress Test demontrated a small fixed Inferolateral perfusion defect   Claudication ([place]) 10/14/2009   Normal Lower Arterial Doppler   Hypertension    controlled   Mixed dyslipidemia    Murmur 10/31/2008   Echo EF >55%  AV apear mildly Sclerotic, No AS   Obstructive sleep apnea    on c-pap   Past Surgical History:  Procedure Laterality Date   HEEL SPUR EXCISION     LUMBAR LAMINECTOMY/DECOMPRESSION MICRODISCECTOMY Right 06/07/2017   Procedure: LUMBAR LAMINECTOMY/DECOMPRESSION MICRODISCECTOMY 1 LEVEL-L5-S1;  Surgeon: Meade Maw, MD;  Location: [facility] ORS;  Service: Neurosurgery;  Laterality: Right;   Ulcers  2002   Stomach Bleeding   Patient Active Problem List   Diagnosis Date Noted   OSA on CPAP 02/19/2017   Bradycardia 05/13/2015   Chest tightness 09/06/2013   Essential hypertension 08/03/2012   Palpitations 08/03/2012   Mixed hyperlipidemia 08/03/2012    PCP: Abran Richard  REFERRING PROVIDER:   Girtha Hake I, MD none none    REFERRING DIAG:  Diagnosis Description  Pt Eval And Tx For Chronic Bilateral Low Back Pain with bilateral-Per Girtha Hake, MD sciatica(M54.42,M54.41, D32.67 Lumbar Stenosis With  Neurogenic Claudication (M48.062) Back And Bilateral Leg Pain    Rationale for Evaluation and Treatment: Rehabilitation  THERAPY DIAG:  Lumber radiculopathy Muscle weakness  ONSET DATE: Pt states several months  SUBJECTIVE:                                                                                                                                                                                           SUBJECTIVE STATEMENT: Pt states that he has been doing some of the exercises and thinks  he might be a little better.  PERTINENT HISTORY:  See above, previous back surgery   PAIN:  Are you having pain? Yes: NPRS scale: 6/10, worst pain is an 8, best 0 Pain location: Rt lower back , radiates into his buttock ; both his feet are constantly numb.  Pain description: aching with occasional sharp Aggravating factors: standing  Relieving factors: sit down   PRECAUTIONS: None  WEIGHT BEARING RESTRICTIONS: No  FALLS:  Has patient fallen in last 6 months? No  LIVING ENVIRONMENT: Lives with: lives with their family Lives in: House/apartment Stairs: Yes: External: 3 steps; on right going up  sometimes it feels like his knee is going to give out going up the steps  Has following equipment at home: None  OCCUPATION: sit and stand  PLOF: Independent  PATIENT GOALS: less pain   NEXT MD VISIT: not scheduled   OBJECTIVE:   DIAGNOSTIC FINDINGS:  IMPRESSION: 1. Prior right hemi laminectomy with micro discectomy at L5-S1 without significant residual canal or lateral recess stenosis. 2. Left foraminal to extraforaminal disc protrusion with prominent reactive endplate spurring at L5-S1, resulting in severe left L5 foraminal stenosis. 3. Disc bulge with small right foraminal disc protrusion at L4-5 with resultant mild to moderate right L4 foraminal stenosis. 4. Disc bulging with facet hypertrophy at L3-4 with resultant mild to moderate right worse than left L5 foraminal stenosis.      Electronically Signed   By: Rise Mu M.D.   On: 09/11/2021 06:45  PATIENT SURVEYS:  FOTO 57     MUSCLE LENGTH: Hamstrings: Right 145 deg; Left 145 deg POSTURE: rounded shoulders and decreased lumbar lordosis  PALPATION: Tight paraspinal mm  LUMBAR ROM:   AROM eval  Flexion Fingers to mid shin with increased pain upon pulling up   Extension 20 reps increases pain  Right lateral flexion   Left lateral flexion   Right rotation   Left rotation    (Blank rows = not tested)   LOWER EXTREMITY MMT:    MMT Right eval Left eval  Hip flexion 5 4  Hip extension 4- 5  Hip abduction 5 4+  Hip adduction    Hip internal rotation    Hip external rotation    Knee flexion 5 5  Knee extension 4- 4+  Ankle dorsiflexion 5 5  Ankle plantarflexion    Ankle inversion    Ankle eversion     (Blank rows = not tested)    FUNCTIONAL TESTS:  30 seconds chair stand test; 5   2 minute walk test: 404 ft with pain down Rt side of his leg.      TODAY'S TREATMENT:                                                                                                                             DATE:  03/23/22 Standing:  Heelraises 20X  Functional squats 2X10  Back extensions 10X  Hip extensions 10X each  Hip abduction 10X each Sit to stands no UE 2X10 from standard height Supine:  Active hamstring stretch 3X30"  Piriformis stretch crossover with pull to armpit 3X30"  Bridge 10X  Bent knee raises 10X with core stab  SLR 10X each with core stab Sidelying clams 10X each Leg press  03/18/22: Standing: Heel raises x 10  Functional squat x 10 Hip excursion x 3 Back extension: pain-free range x 10  Sitting:  Sit to stand x 10  Supine:  Knee to chest B x 30" x 3 Active hamstring stretch 3 x 30" Piriformis stretch (figure 4) Bridge x 10  Bent knee raise x 10 Side lying clam x 10  Leg press 4 pl x 10   03/09/22  Sitting: LAQ RT x 10              Ab set x 10               Scapular retraction x 10  Supine: bridge x 10              Active hamstring stretch x 3 for 30" each   PATIENT EDUCATION:  Education details: HEP Person educated: Patient Education method: Explanation Education comprehension: verbalized understanding and returned demonstration  HOME EXERCISE PROGRAM:             12/29: Access Code: [email or web address] Exercises - Heel Raises with Counter Support  - 1 x daily - 7 x weekly - 1 sets - 10 reps - 3-5" hold - Mini Squat  - 1 x daily - 7 x weekly - 1 sets - 10 reps - 3-5" hold - Hooklying Small March  - 1 x daily - 7 x weekly - 1 sets - 10 reps - 3-5" hold - Supine Single Knee to Chest Stretch  - 1 x daily - 7 x weekly - 1 sets - 3 reps - 30" hold Access Code: [email or web address] URL: [email or web address] Date: 03/09/2022 Prepared by: Virgina Organ  Exercises - Seated Long Arc Quad  - 2 x daily - 7 x weekly - 1 sets - 10 reps - 5" hold - Seated Scapular Retraction  - 2 x daily - 7 x weekly - 1 sets - 10 reps - 5" hold - Abdominal Bracing  - 4 x daily - 7 x weekly - 1 sets - 10 reps - 5" hold - Supine Hamstring Stretch  - 2 x daily - 7 x weekly - 1 sets - 3 reps - 30" hold - Supine Bridge  - 2 x daily - 7 x weekly - 1 sets - 10 reps - 5" hold  ASSESSMENT:  CLINICAL IMPRESSION: Continued with lumbar stabilization and LE strengthening.  Pt with poor form with squats but able to correct with verbal and tactile cues. Added hip strengthening in standing with cues to maintain upright posturing.  Good form with maintenance of stabilization with sidelying clams.  Pt able to complete all exercises today without complaints of pain or issues.  Pt will continue to benefit from skilled PT to reduce deficits and improve overall functioning.  OBJECTIVE IMPAIRMENTS: decreased activity tolerance, difficulty walking, decreased ROM, decreased strength, hypomobility, impaired flexibility, improper body mechanics, and pain.   ACTIVITY LIMITATIONS:  carrying, lifting, standing, stairs, and locomotion level  PARTICIPATION LIMITATIONS: community activity, occupation, and yard work  PERSONAL FACTORS: Past/current experiences are also affecting patient's functional outcome.   REHAB POTENTIAL: Good  CLINICAL DECISION MAKING: Evolving/moderate complexity  EVALUATION COMPLEXITY: Moderate   GOALS: Goals  reviewed with patient? No  SHORT TERM GOALS: Target date: 03/30/22  Pt to be I in HEP in order to decrease his pain to no greater than a 5/10 Baseline: Goal status: IN PROGRESS  2.  PT LE strength to be increased 1/2 grade to be able to rise out of a couch without UE with ease.  Baseline:  Goal status: IN PROGRESS  3.  PT to be able to stand for 15 minutes without increased sx  Baseline:  Goal status: IN PROGRESS  4.  PT to demonstrate good bed mobility body mechanics  Baseline:  Goal status: IN PROGRESS    LONG TERM GOALS: Target date: 04/20/2022  Pt to be I in HEP in order to decrease his pain to no greater than a 3/10 Baseline:  Goal status: IN PROGRESS  2.  PT LE strength to be increased 1 grade to be able to rise from a crouched position to pick items off the floor.  Baseline:  Goal status: IN PROGRESS  3.  PT to be able to stand for 30 minutes without increased sx  Baseline:  Goal status: IN PROGRESS  4.  PT to demonstrate good  body mechanics for lifting  Baseline:  Goal status: IN PROGRESS  5.  PT to no longer have radicular sx.  Baseline:  Goal status: IN PROGRESS  PLAN:  PT FREQUENCY: 2x/week  PT DURATION: 6 weeks  PLANNED INTERVENTIONS: Therapeutic exercises, Therapeutic activity, Balance training, Patient/Family education, Self Care, and Manual therapy.  PLAN FOR NEXT SESSION:  Progress core stabilization as well as LE strengthening.   03/23/2022, 4:45 PM Amiliana Foutz Sula Soda, PTA/CLT [place] Ph: [phone]

## 2022-03-29 ENCOUNTER — Encounter (HOSPITAL_COMMUNITY): Payer: BC Managed Care – PPO | Admitting: Physical Therapy

## 2022-04-01 ENCOUNTER — Ambulatory Visit (HOSPITAL_COMMUNITY): Payer: BC Managed Care – PPO | Admitting: Physical Therapy

## 2022-04-01 DIAGNOSIS — M6281 Muscle weakness (generalized): Secondary | ICD-10-CM

## 2022-04-01 DIAGNOSIS — M5416 Radiculopathy, lumbar region: Secondary | ICD-10-CM

## 2022-04-01 NOTE — Therapy (Signed)
OUTPATIENT PHYSICAL THERAPY THORACOLUMBAR Treatment   Patient Name: Joshua Mann MRN: [id number] DOB:04-04-1960, 62 y.o., male Today's Date: 04/01/2022   END OF SESSION: Time:  8937-3428  PT End of Session - 04/01/22 1704     Visit Number 4    Number of Visits 13    Date for PT Re-Evaluation 04/20/22    Authorization Type BCBS    Authorization - Number of Visits 30    Progress Note Due on Visit 10    Activity Tolerance Patient tolerated treatment well    Behavior During Therapy [facility] for tasks assessed/performed                   Past Medical History:  Diagnosis Date   Bruit 10/14/2009   Asymptomatic Bruit, Normal Carotid Doppler   Chest pain 10/14/2009   Stress Test demontrated a small fixed Inferolateral perfusion defect   Claudication ([facility]) 10/14/2009   Normal Lower Arterial Doppler   Hypertension    controlled   Mixed dyslipidemia    Murmur 10/31/2008   Echo EF >55%  AV apear mildly Sclerotic, No AS   Obstructive sleep apnea    on c-pap   Past Surgical History:  Procedure Laterality Date   HEEL SPUR EXCISION     LUMBAR LAMINECTOMY/DECOMPRESSION MICRODISCECTOMY Right 06/07/2017   Procedure: LUMBAR LAMINECTOMY/DECOMPRESSION MICRODISCECTOMY 1 LEVEL-L5-S1;  Surgeon: Venetia Night, MD;  Location: [facility] ORS;  Service: Neurosurgery;  Laterality: Right;   Ulcers  2002   Stomach Bleeding   Patient Active Problem List   Diagnosis Date Noted   OSA on CPAP 02/19/2017   Bradycardia 05/13/2015   Chest tightness 09/06/2013   Essential hypertension 08/03/2012   Palpitations 08/03/2012   Mixed hyperlipidemia 08/03/2012    PCP: Alvina Filbert  REFERRING PROVIDER:   Filomena Jungling I, MD none none    REFERRING DIAG:  Diagnosis Description  Pt Eval And Tx For Chronic Bilateral Low Back Pain with bilateral-Per Filomena Jungling, MD sciatica(M54.42,M54.41, J68.11 Lumbar Stenosis With Neurogenic Claudication (M48.062) Back And Bilateral Leg Pain     Rationale for Evaluation and Treatment: Rehabilitation  THERAPY DIAG:  Lumber radiculopathy Muscle weakness  ONSET DATE: Pt states several months  SUBJECTIVE:                                                                                                                                                                                           SUBJECTIVE STATEMENT:  Pt states that his back is really bothering him today he is not sure why.  When he walks his Rt side hurts.  Pt has been doing his exercises 1-2 x a  day  PERTINENT HISTORY:  See above, previous back surgery   PAIN:  Are you having pain? Yes: NPRS scale: 7/10, worst pain is an 8, best 0 Pain location: Rt lower back , radiates into his buttock ; both his feet are constantly numb.  Pain description: aching with occasional sharp Aggravating factors: standing  Relieving factors: sit down   PRECAUTIONS: None  WEIGHT BEARING RESTRICTIONS: No  FALLS:  Has patient fallen in last 6 months? No  LIVING ENVIRONMENT: Lives with: lives with their family Lives in: House/apartment Stairs: Yes: External: 3 steps; on right going up  sometimes it feels like his knee is going to give out going up the steps  Has following equipment at home: None  OCCUPATION: sit and stand  PLOF: Independent  PATIENT GOALS: less pain   NEXT MD VISIT: not scheduled   OBJECTIVE:   DIAGNOSTIC FINDINGS:  IMPRESSION: 1. Prior right hemi laminectomy with micro discectomy at L5-S1 without significant residual canal or lateral recess stenosis. 2. Left foraminal to extraforaminal disc protrusion with prominent reactive endplate spurring at N4-O2, resulting in severe left L5 foraminal stenosis. 3. Disc bulge with small right foraminal disc protrusion at L4-5 with resultant mild to moderate right L4 foraminal stenosis. 4. Disc bulging with facet hypertrophy at L3-4 with resultant mild to moderate right worse than left L5 foraminal stenosis.      Electronically Signed   By: Jeannine Boga M.D.   On: 09/11/2021 06:45  PATIENT SURVEYS:  FOTO 57     MUSCLE LENGTH: Hamstrings: Right 145 deg; Left 145 deg POSTURE: rounded shoulders and decreased lumbar lordosis  PALPATION: Tight paraspinal mm  LUMBAR ROM:   AROM eval  Flexion Fingers to mid shin with increased pain upon pulling up   Extension 20 reps increases pain  Right lateral flexion   Left lateral flexion   Right rotation   Left rotation    (Blank rows = not tested)   LOWER EXTREMITY MMT:    MMT Right eval Left eval  Hip flexion 5 4  Hip extension 4- 5  Hip abduction 5 4+  Hip adduction    Hip internal rotation    Hip external rotation    Knee flexion 5 5  Knee extension 4- 4+  Ankle dorsiflexion 5 5  Ankle plantarflexion    Ankle inversion    Ankle eversion     (Blank rows = not tested)    FUNCTIONAL TESTS:  30 seconds chair stand test; 5   2 minute walk test: 404 ft with pain down Rt side of his leg.      TODAY'S TREATMENT:                                                                                                                             DATE: 04/01/22: Bridge x 15 Hip ab/adduction isometric to clear pelvic area Contract relax completed to adjust SI with good results Gt x 2 minutes  with decreased pain Repeat   Bridge x 15 Hip ab/adduction isometric  SI checked and no need for alignment as pt was in proper alignment.  03/23/22 Standing:  Heelraises 20X  Functional squats 2X10  Back extensions 10X  Hip extensions 10X each  Hip abduction 10X each Sit to stands no UE 2X10 from standard height Supine:  Active hamstring stretch 3X30"  Piriformis stretch crossover with pull to armpit 3X30"  Bridge 10X  Bent knee raises 10X with core stab  SLR 10X each with core stab Sidelying clams 10X each Leg press  03/18/22: Standing: Heel raises x 10  Functional squat x 10 Hip excursion x 3 Back extension: pain-free range x 10   Sitting:  Sit to stand x 10  Supine:  Knee to chest B x 30" x 3 Active hamstring stretch 3 x 30" Piriformis stretch (figure 4) Bridge x 10  Bent knee raise x 10 Side lying clam x 10  Leg press 4 pl x 10   03/09/22  Sitting: LAQ RT x 10              Ab set x 10              Scapular retraction x 10  Supine: bridge x 10              Active hamstring stretch x 3 for 30" each   PATIENT EDUCATION:  Education details: HEP Person educated: Patient Education method: Explanation Education comprehension: verbalized understanding and returned demonstration  HOME EXERCISE PROGRAM: 04/01/22:  Pt given SI sheet to I posterior rotate Rt SI             12/29: Access Code: [email or web address] Exercises - Heel Raises with Counter Support  - 1 x daily - 7 x weekly - 1 sets - 10 reps - 3-5" hold - Mini Squat  - 1 x daily - 7 x weekly - 1 sets - 10 reps - 3-5" hold - Hooklying Small March  - 1 x daily - 7 x weekly - 1 sets - 10 reps - 3-5" hold - Supine Single Knee to Chest Stretch  - 1 x daily - 7 x weekly - 1 sets - 3 reps - 30" hold Access Code: [email or web address] URL: [email or web address] Date: 03/09/2022 Prepared by: Rayetta Humphrey  Exercises - Seated Long Arc Quad  - 2 x daily - 7 x weekly - 1 sets - 10 reps - 5" hold - Seated Scapular Retraction  - 2 x daily - 7 x weekly - 1 sets - 10 reps - 5" hold - Abdominal Bracing  - 4 x daily - 7 x weekly - 1 sets - 10 reps - 5" hold - Supine Hamstring Stretch  - 2 x daily - 7 x weekly - 1 sets - 3 reps - 30" hold - Supine Bridge  - 2 x daily - 7 x weekly - 1 sets - 10 reps - 5" hold  ASSESSMENT:  CLINICAL IMPRESSION: Pt demonstrates poor bed mobility body mechanics.  Therapist went over correct body mechanics with patient.  SI checked with noted posterior SI rotation. Manual completed with successful placement of SI.  Pain decreased from a 7 to a 4 Pt given HEP to keep RT SI in place. Pt will continue to benefit from skilled PT to reduce  deficits and improve overall functioning.  OBJECTIVE IMPAIRMENTS: decreased activity tolerance, difficulty walking, decreased ROM, decreased strength, hypomobility, impaired flexibility, improper body mechanics, and pain.  ACTIVITY LIMITATIONS: carrying, lifting, standing, stairs, and locomotion level  PARTICIPATION LIMITATIONS: community activity, occupation, and yard work  PERSONAL FACTORS: Past/current experiences are also affecting patient's functional outcome.   REHAB POTENTIAL: Good  CLINICAL DECISION MAKING: Evolving/moderate complexity  EVALUATION COMPLEXITY: Moderate   GOALS: Goals reviewed with patient? No  SHORT TERM GOALS: Target date: 03/30/22  Pt to be I in HEP in order to decrease his pain to no greater than a 5/10 Baseline: Goal status: IN PROGRESS  2.  PT LE strength to be increased 1/2 grade to be able to rise out of a couch without UE with ease.  Baseline:  Goal status: IN PROGRESS  3.  PT to be able to stand for 15 minutes without increased sx  Baseline:  Goal status: IN PROGRESS  4.  PT to demonstrate good bed mobility body mechanics  Baseline:  Goal status: IN PROGRESS    LONG TERM GOALS: Target date: 04/20/2022  Pt to be I in HEP in order to decrease his pain to no greater than a 3/10 Baseline:  Goal status: IN PROGRESS  2.  PT LE strength to be increased 1 grade to be able to rise from a crouched position to pick items off the floor.  Baseline:  Goal status: IN PROGRESS  3.  PT to be able to stand for 30 minutes without increased sx  Baseline:  Goal status: IN PROGRESS  4.  PT to demonstrate good  body mechanics for lifting  Baseline:  Goal status: IN PROGRESS  5.  PT to no longer have radicular sx.  Baseline:  Goal status: IN PROGRESS  PLAN:  PT FREQUENCY: 2x/week  PT DURATION: 6 weeks  PLANNED INTERVENTIONS: Therapeutic exercises, Therapeutic activity, Balance training, Patient/Family education, Self Care, and Manual  therapy.  PLAN FOR NEXT SESSION:  Progress core stabilization as well as LE strengthening.  Virgina Organ, PT CLT [phone]  [phone]

## 2022-04-05 ENCOUNTER — Encounter (HOSPITAL_COMMUNITY): Payer: BC Managed Care – PPO

## 2022-04-07 ENCOUNTER — Encounter (HOSPITAL_COMMUNITY): Payer: BC Managed Care – PPO | Admitting: Physical Therapy

## 2022-04-11 ENCOUNTER — Ambulatory Visit (HOSPITAL_COMMUNITY): Payer: BC Managed Care – PPO | Admitting: Physical Therapy

## 2022-04-11 DIAGNOSIS — M5416 Radiculopathy, lumbar region: Secondary | ICD-10-CM | POA: Diagnosis not present

## 2022-04-11 DIAGNOSIS — M6281 Muscle weakness (generalized): Secondary | ICD-10-CM

## 2022-04-11 NOTE — Therapy (Signed)
OUTPATIENT PHYSICAL THERAPY THORACOLUMBAR Treatment   Patient Name: Joshua Mann MRN: [id number] DOB:Dec 11, 1960, 62 y.o., male Today's Date: 04/11/2022   END OF SESSION: Time:  0347-4259  PT End of Session - 04/11/22 1514     Visit Number 5    Number of Visits 13    Date for PT Re-Evaluation 04/20/22    Authorization Type BCBS    Authorization - Visit Number 5    Authorization - Number of Visits 30    Progress Note Due on Visit 10    PT Start Time 1515    PT Stop Time 1555    PT Time Calculation (min) 40 min    Activity Tolerance Patient tolerated treatment well    Behavior During Therapy [facility] for tasks assessed/performed                   Past Medical History:  Diagnosis Date   Bruit 10/14/2009   Asymptomatic Bruit, Normal Carotid Doppler   Chest pain 10/14/2009   Stress Test demontrated a small fixed Inferolateral perfusion defect   Claudication (HCC) 10/14/2009   Normal Lower Arterial Doppler   Hypertension    controlled   Mixed dyslipidemia    Murmur 10/31/2008   Echo EF >55%  AV apear mildly Sclerotic, No AS   Obstructive sleep apnea    on c-pap   Past Surgical History:  Procedure Laterality Date   HEEL SPUR EXCISION     LUMBAR LAMINECTOMY/DECOMPRESSION MICRODISCECTOMY Right 06/07/2017   Procedure: LUMBAR LAMINECTOMY/DECOMPRESSION MICRODISCECTOMY 1 LEVEL-L5-S1;  Surgeon: Venetia Night, MD;  Location: [facility] ORS;  Service: Neurosurgery;  Laterality: Right;   Ulcers  2002   Stomach Bleeding   Patient Active Problem List   Diagnosis Date Noted   OSA on CPAP 02/19/2017   Bradycardia 05/13/2015   Chest tightness 09/06/2013   Essential hypertension 08/03/2012   Palpitations 08/03/2012   Mixed hyperlipidemia 08/03/2012    PCP: Alvina Filbert  REFERRING PROVIDER:   Filomena Jungling I, MD none none    REFERRING DIAG:  Diagnosis Description  Pt Eval And Tx For Chronic Bilateral Low Back Pain with bilateral-Per Filomena Jungling, MD  sciatica(M54.42,M54.41, D63.87 Lumbar Stenosis With Neurogenic Claudication (M48.062) Back And Bilateral Leg Pain    Rationale for Evaluation and Treatment: Rehabilitation  THERAPY DIAG:  Lumber radiculopathy Muscle weakness  ONSET DATE: Pt states several months  SUBJECTIVE:                                                                                                                                                                                           SUBJECTIVE STATEMENT:  Pt states  that his back is hurting today around 5-6, central lower back and across over to each side equally. Both sides hurt equally today.  STates he wants to get back to the doctor this week about the pain.   PERTINENT HISTORY:  See above, previous back surgery   PAIN:  Are you having pain? Yes: NPRS scale: 7/10, worst pain is an 8, best 0 Pain location: Rt lower back , radiates into his buttock ; both his feet are constantly numb.  Pain description: aching with occasional sharp Aggravating factors: standing  Relieving factors: sit down   PRECAUTIONS: None  WEIGHT BEARING RESTRICTIONS: No  FALLS:  Has patient fallen in last 6 months? No  LIVING ENVIRONMENT: Lives with: lives with their family Lives in: House/apartment Stairs: Yes: External: 3 steps; on right going up  sometimes it feels like his knee is going to give out going up the steps  Has following equipment at home: None  OCCUPATION: sit and stand  PLOF: Independent  PATIENT GOALS: less pain   NEXT MD VISIT: not scheduled   OBJECTIVE:   DIAGNOSTIC FINDINGS:  IMPRESSION: 1. Prior right hemi laminectomy with micro discectomy at L5-S1 without significant residual canal or lateral recess stenosis. 2. Left foraminal to extraforaminal disc protrusion with prominent reactive endplate spurring at J6-R6, resulting in severe left L5 foraminal stenosis. 3. Disc bulge with small right foraminal disc protrusion at L4-5 with resultant  mild to moderate right L4 foraminal stenosis. 4. Disc bulging with facet hypertrophy at L3-4 with resultant mild to moderate right worse than left L5 foraminal stenosis.     Electronically Signed   By: Jeannine Boga M.D.   On: 09/11/2021 06:45  PATIENT SURVEYS:  FOTO 57     MUSCLE LENGTH: Hamstrings: Right 145 deg; Left 145 deg POSTURE: rounded shoulders and decreased lumbar lordosis  PALPATION: Tight paraspinal mm  LUMBAR ROM:   AROM eval  Flexion Fingers to mid shin with increased pain upon pulling up   Extension 20 reps increases pain  Right lateral flexion   Left lateral flexion   Right rotation   Left rotation    (Blank rows = not tested)   LOWER EXTREMITY MMT:    MMT Right eval Left eval  Hip flexion 5 4  Hip extension 4- 5  Hip abduction 5 4+  Hip adduction    Hip internal rotation    Hip external rotation    Knee flexion 5 5  Knee extension 4- 4+  Ankle dorsiflexion 5 5  Ankle plantarflexion    Ankle inversion    Ankle eversion     (Blank rows = not tested)    FUNCTIONAL TESTS:  30 seconds chair stand test; 5   2 minute walk test: 404 ft with pain down Rt side of his leg.      TODAY'S TREATMENT:  DATE: 04/11/22 SI checked and no need for alignment as pt was in proper alignment.  Standing:  heelraise/toeraise 20X each  Functional squats 20X  Alternating march 20X  Hip abduction 20X  Hip extension 20X  Hamstring stretch onto 12" step 3X20" each Seated: long sitting hamstring stretch 2X30" each  Piriformis stretch 3X30" each  04/01/22: Bridge x 15 Hip ab/adduction isometric to clear pelvic area Contract relax completed to adjust SI with good results Gt x 2 minutes with decreased pain Repeat   Bridge x 15 Hip ab/adduction isometric  SI checked and no need for alignment as pt was in proper alignment.    03/23/22 Standing:  Heelraises 20X  Functional squats 2X10  Back extensions 10X  Hip extensions 10X each  Hip abduction 10X each Sit to stands no UE 2X10 from standard height Supine:  Active hamstring stretch 3X30"  Piriformis stretch crossover with pull to armpit 3X30"  Bridge 10X  Bent knee raises 10X with core stab  SLR 10X each with core stab Sidelying clams 10X each Leg press  03/18/22: Standing: Heel raises x 10  Functional squat x 10 Hip excursion x 3 Back extension: pain-free range x 10  Sitting:  Sit to stand x 10  Supine:  Knee to chest B x 30" x 3 Active hamstring stretch 3 x 30" Piriformis stretch (figure 4) Bridge x 10  Bent knee raise x 10 Side lying clam x 10  Leg press 4 pl x 10   03/09/22  Sitting: LAQ RT x 10              Ab set x 10              Scapular retraction x 10  Supine: bridge x 10              Active hamstring stretch x 3 for 30" each   PATIENT EDUCATION:  Education details: HEP Person educated: Patient Education method: Explanation Education comprehension: verbalized understanding and returned demonstration  HOME EXERCISE PROGRAM: 04/01/22:  Pt given SI sheet to I posterior rotate Rt SI             12/29: Access Code: [email or web address] Exercises - Heel Raises with Counter Support  - 1 x daily - 7 x weekly - 1 sets - 10 reps - 3-5" hold - Mini Squat  - 1 x daily - 7 x weekly - 1 sets - 10 reps - 3-5" hold - Hooklying Small March  - 1 x daily - 7 x weekly - 1 sets - 10 reps - 3-5" hold - Supine Single Knee to Chest Stretch  - 1 x daily - 7 x weekly - 1 sets - 3 reps - 30" hold Access Code: [email or web address] URL: [email or web address] Date: 03/09/2022 Prepared by: Rayetta Humphrey  Exercises - Seated Long Arc [place]  - 2 x daily - 7 x weekly - 1 sets - 10 reps - 5" hold - Seated Scapular Retraction  - 2 x daily - 7 x weekly - 1 sets - 10 reps - 5" hold - Abdominal Bracing  - 4 x daily - 7 x weekly - 1 sets - 10 reps - 5" hold -  Supine Hamstring Stretch  - 2 x daily - 7 x weekly - 1 sets - 3 reps - 30" hold - Supine Bridge  - 2 x daily - 7 x weekly - 1 sets - 10 reps - 5" hold  ASSESSMENT:  CLINICAL IMPRESSION: SI checked and  no need for alignment today. Pain more centralized and slightly reduced since last visit.  PT indicates last treatment did help some and he's been doing the exercises given.  Added seated piriformis stretch with extreme tightness noted, Rt >Lt.  Hamstring stretch also revealed tightness and completed both in long sitting and standing.  Printed both of these out for pt to complete with HEP.   Continued with LE strengthening and core stabilization.  Continues to demonstrate poor body mechanics despite cues.  Pt will continue to benefit from skilled PT to reduce deficits and improve overall functioning.  OBJECTIVE IMPAIRMENTS: decreased activity tolerance, difficulty walking, decreased ROM, decreased strength, hypomobility, impaired flexibility, improper body mechanics, and pain.   ACTIVITY LIMITATIONS: carrying, lifting, standing, stairs, and locomotion level  PARTICIPATION LIMITATIONS: community activity, occupation, and yard work  PERSONAL FACTORS: Past/current experiences are also affecting patient's functional outcome.   REHAB POTENTIAL: Good  CLINICAL DECISION MAKING: Evolving/moderate complexity  EVALUATION COMPLEXITY: Moderate   GOALS: Goals reviewed with patient? No  SHORT TERM GOALS: Target date: 03/30/22  Pt to be I in HEP in order to decrease his pain to no greater than a 5/10 Baseline: Goal status: IN PROGRESS  2.  PT LE strength to be increased 1/2 grade to be able to rise out of a couch without UE with ease.  Baseline:  Goal status: IN PROGRESS  3.  PT to be able to stand for 15 minutes without increased sx  Baseline:  Goal status: IN PROGRESS  4.  PT to demonstrate good bed mobility body mechanics  Baseline:  Goal status: IN PROGRESS    LONG TERM GOALS: Target  date: 04/20/2022  Pt to be I in HEP in order to decrease his pain to no greater than a 3/10 Baseline:  Goal status: IN PROGRESS  2.  PT LE strength to be increased 1 grade to be able to rise from a crouched position to pick items off the floor.  Baseline:  Goal status: IN PROGRESS  3.  PT to be able to stand for 30 minutes without increased sx  Baseline:  Goal status: IN PROGRESS  4.  PT to demonstrate good  body mechanics for lifting  Baseline:  Goal status: IN PROGRESS  5.  PT to no longer have radicular sx.  Baseline:  Goal status: IN PROGRESS  PLAN:  PT FREQUENCY: 2x/week  PT DURATION: 6 weeks  PLANNED INTERVENTIONS: Therapeutic exercises, Therapeutic activity, Balance training, Patient/Family education, Self Care, and Manual therapy.  PLAN FOR NEXT SESSION:  Progress core stabilization as well as LE strengthening.   Lurena Nida, PTA/CLT [facility] Ph: [phone]  Lurena Nida, PTA 04/11/2022, 3:25 PM

## 2022-04-13 ENCOUNTER — Ambulatory Visit (HOSPITAL_COMMUNITY): Payer: BC Managed Care – PPO | Admitting: Physical Therapy

## 2022-04-13 DIAGNOSIS — M5416 Radiculopathy, lumbar region: Secondary | ICD-10-CM | POA: Diagnosis not present

## 2022-04-13 DIAGNOSIS — M6281 Muscle weakness (generalized): Secondary | ICD-10-CM

## 2022-04-13 NOTE — Therapy (Signed)
OUTPATIENT PHYSICAL THERAPY THORACOLUMBAR Treatment   Patient Name: Joshua Mann MRN: [id number] DOB:22-Jan-1961, 62 y.o., male Today's Date: 04/13/2022   END OF SESSION: Time:  2778-2423  PT End of Session - 04/13/22 1543     Visit Number 6    Number of Visits 13    Date for PT Re-Evaluation 05/19/22    Authorization Type BCBS    Authorization - Visit Number 6    Authorization - Number of Visits 30    Progress Note Due on Visit 10    PT Start Time 1518    PT Stop Time 1600    PT Time Calculation (min) 42 min    Activity Tolerance Patient tolerated treatment well    Behavior During Therapy [facility] for tasks assessed/performed                   Past Medical History:  Diagnosis Date   Bruit 10/14/2009   Asymptomatic Bruit, Normal Carotid Doppler   Chest pain 10/14/2009   Stress Test demontrated a small fixed Inferolateral perfusion defect   Claudication (HCC) 10/14/2009   Normal Lower Arterial Doppler   Hypertension    controlled   Mixed dyslipidemia    Murmur 10/31/2008   Echo EF >55%  AV apear mildly Sclerotic, No AS   Obstructive sleep apnea    on c-pap   Past Surgical History:  Procedure Laterality Date   HEEL SPUR EXCISION     LUMBAR LAMINECTOMY/DECOMPRESSION MICRODISCECTOMY Right 06/07/2017   Procedure: LUMBAR LAMINECTOMY/DECOMPRESSION MICRODISCECTOMY 1 LEVEL-L5-S1;  Surgeon: Venetia Night, MD;  Location: [facility] ORS;  Service: Neurosurgery;  Laterality: Right;   Ulcers  2002   Stomach Bleeding   Patient Active Problem List   Diagnosis Date Noted   OSA on CPAP 02/19/2017   Bradycardia 05/13/2015   Chest tightness 09/06/2013   Essential hypertension 08/03/2012   Palpitations 08/03/2012   Mixed hyperlipidemia 08/03/2012    PCP: Alvina Filbert  REFERRING PROVIDER:   Filomena Jungling I, MD none none    REFERRING DIAG:  Diagnosis Description  Pt Eval And Tx For Chronic Bilateral Low Back Pain with bilateral-Per Filomena Jungling, MD  sciatica(M54.42,M54.41, N36.14 Lumbar Stenosis With Neurogenic Claudication (M48.062) Back And Bilateral Leg Pain    Rationale for Evaluation and Treatment: Rehabilitation  THERAPY DIAG:  Lumber radiculopathy Muscle weakness  ONSET DATE: Pt states several months  SUBJECTIVE:                                                                                                                                                                                           SUBJECTIVE STATEMENT:  Pt states  that he is doing his exercises twice a day.  He is still having pain on the Rt side of his back and his knee.    PERTINENT HISTORY:  See above, previous back surgery   PAIN:  Are you having pain? Yes: NPRS scale: 5/10, worst pain is an 7, best 0 Pain location: Rt lower back , radiates into his buttock ; both his feet are constantly numb.  Pain description: aching with occasional sharp Aggravating factors: standing  Relieving factors: sit down   PRECAUTIONS: None  WEIGHT BEARING RESTRICTIONS: No  FALLS:  Has patient fallen in last 6 months? No  LIVING ENVIRONMENT: Lives with: lives with their family Lives in: House/apartment Stairs: Yes: External: 3 steps; on right going up  sometimes it feels like his knee is going to give out going up the steps  Has following equipment at home: None  OCCUPATION: sit and stand  PLOF: Independent  PATIENT GOALS: less pain   NEXT MD VISIT: not scheduled   OBJECTIVE:   DIAGNOSTIC FINDINGS:  IMPRESSION: 1. Prior right hemi laminectomy with micro discectomy at L5-S1 without significant residual canal or lateral recess stenosis. 2. Left foraminal to extraforaminal disc protrusion with prominent reactive endplate spurring at P8-K9, resulting in severe left L5 foraminal stenosis. 3. Disc bulge with small right foraminal disc protrusion at L4-5 with resultant mild to moderate right L4 foraminal stenosis. 4. Disc bulging with facet hypertrophy at L3-4  with resultant mild to moderate right worse than left L5 foraminal stenosis.     Electronically Signed   By: Jeannine Boga M.D.   On: 09/11/2021 06:45  PATIENT SURVEYS:  FOTO 57 04/13/2022:  currently 66    MUSCLE LENGTH: Hamstrings: Right 145 deg; Left 145 deg;   04/13/22: RT 155 degrees at 90/90 position; LT 150 POSTURE: rounded shoulders and decreased lumbar lordosis  PALPATION: Tight paraspinal mm  LUMBAR ROM:   AROM eval 04/13/22  Flexion Fingers to mid shin with increased pain upon pulling up  Fingers to 1/3 distal shin.  Extension 20 reps increases pain 25 degrees ; reps   Right lateral flexion    Left lateral flexion    Right rotation    Left rotation     (Blank rows = not tested)   LOWER EXTREMITY MMT:    MMT Right eval Right  04/13/22 Left eval Left  04/13/22  Hip flexion 5 5 4 5   Hip extension 4- 4+ 5 5  Hip abduction 5 5 4+ 4+  Hip adduction      Hip internal rotation      Hip external rotation      Knee flexion 5 5 5 5   Knee extension 4- 5 4+ 5  Ankle dorsiflexion 5 5 5 5   Ankle plantarflexion      Ankle inversion      Ankle eversion       (Blank rows = not tested)    FUNCTIONAL TESTS:  30 seconds chair stand test; 5  ;   04/13/22:  30 second sit to stand:  8 reps ;  9 is poor for pt age and sex .   2 minute walk test: 404 ft with pain down Rt side of his leg.  04/13/22:  565 ft in 2 minute period back is tight and pulling buttock.     TODAY'S TREATMENT:  DATE: 04/13/2022:  Sitting: Piriformis stretch x 2 hold for 1 minute Sit to stand x 10 Supine hamstring stretch 2 x 30" B   Knee to chest x 30" x 2 Bridge x 10 hold 5" Prone:  Single leg raise x 10  Forearm plank x 10"  x 5   04/11/22 SI checked and no need for alignment as pt was in proper alignment.  Standing:  heelraise/toeraise 20X each  Functional  squats 20X  Alternating march 20X  Hip abduction 20X  Hip extension 20X  Hamstring stretch onto 12" step 3X20" each Seated: long sitting hamstring stretch 2X30" each  Piriformis stretch 3X30" each  04/01/22: Bridge x 15 Hip ab/adduction isometric to clear pelvic area Contract relax completed to adjust SI with good results Gt x 2 minutes with decreased pain Repeat   Bridge x 15 Hip ab/adduction isometric  SI checked and no need for alignment as pt was in proper alignment.   03/23/22 Standing:  Heelraises 20X  Functional squats 2X10  Back extensions 10X  Hip extensions 10X each  Hip abduction 10X each Sit to stands no UE 2X10 from standard height Supine:  Active hamstring stretch 3X30"  Piriformis stretch crossover with pull to armpit 3X30"  Bridge 10X  Bent knee raises 10X with core stab  SLR 10X each with core stab Sidelying clams 10X each Leg press  03/18/22: Standing: Heel raises x 10  Functional squat x 10 Hip excursion x 3 Back extension: pain-free range x 10  Sitting:  Sit to stand x 10  Supine:  Knee to chest B x 30" x 3 Active hamstring stretch 3 x 30" Piriformis stretch (figure 4) Bridge x 10  Bent knee raise x 10 Side lying clam x 10  Leg press 4 pl x 10   03/09/22  Sitting: LAQ RT x 10              Ab set x 10              Scapular retraction x 10  Supine: bridge x 10              Active hamstring stretch x 3 for 30" each   PATIENT EDUCATION:  Education details: HEP Person educated: Patient Education method: Explanation Education comprehension: verbalized understanding and returned demonstration  HOME EXERCISE PROGRAM:Access Code: [email or web address] URL: [email or web address] Date: 04/13/2022 Prepared by: Virgina Organ  Exercises - Prone Hip Extension  - 1 x daily - 7 x weekly - 1 sets - 10 reps - 3" hold - Standard Plank  - 1 x daily - 7 x weekly - 1 sets - 5 reps - 10" hold   04/01/22:  Pt given SI sheet to I posterior rotate Rt  SI             12/29: Access Code: [email or web address] Exercises - Heel Raises with Counter Support  - 1 x daily - 7 x weekly - 1 sets - 10 reps - 3-5" hold - Mini Squat  - 1 x daily - 7 x weekly - 1 sets - 10 reps - 3-5" hold - Hooklying Small March  - 1 x daily - 7 x weekly - 1 sets - 10 reps - 3-5" hold - Supine Single Knee to Chest Stretch  - 1 x daily - 7 x weekly - 1 sets - 3 reps - 30" hold Access Code: [email or web address] URL: [email or web address] Date: 03/09/2022 Prepared by: Virgina Organ  Exercises - Seated Long Arc [place]  -  2 x daily - 7 x weekly - 1 sets - 10 reps - 5" hold - Seated Scapular Retraction  - 2 x daily - 7 x weekly - 1 sets - 10 reps - 5" hold - Abdominal Bracing  - 4 x daily - 7 x weekly - 1 sets - 10 reps - 5" hold - Supine Hamstring Stretch  - 2 x daily - 7 x weekly - 1 sets - 3 reps - 30" hold - Supine Bridge  - 2 x daily - 7 x weekly - 1 sets - 10 reps - 5" hold  ASSESSMENT:  CLINICAL IMPRESSION: PT reassessed with noted improvement in all aspects.  PT continues to have tightness of his hamstrings, some weakness and increased pain and will continue to benefit from skilled PT for advanced stabilization to reduce deficits and improve overall functioning.  OBJECTIVE IMPAIRMENTS: decreased activity tolerance, difficulty walking, decreased ROM, decreased strength, hypomobility, impaired flexibility, improper body mechanics, and pain.   ACTIVITY LIMITATIONS: carrying, lifting, standing, stairs, and locomotion level  PARTICIPATION LIMITATIONS: community activity, occupation, and yard work  PERSONAL FACTORS: Past/current experiences are also affecting patient's functional outcome.   REHAB POTENTIAL: Good  CLINICAL DECISION MAKING: Evolving/moderate complexity  EVALUATION COMPLEXITY: Moderate   GOALS: Goals reviewed with patient? No  SHORT TERM GOALS: Target date: 03/30/22  Pt to be I in HEP in order to decrease his pain to no greater than a  5/10 Baseline: Goal status: IN PROGRESS  2.  PT LE strength to be increased 1/2 grade to be able to rise out of a couch without UE with ease.  Baseline:  Goal status: MET  3.  PT to be able to stand for 15 minutes without increased sx  Baseline:  Goal status: MET  4.  PT to demonstrate good bed mobility body mechanics  Baseline:  Goal status: IN PROGRESS    LONG TERM GOALS: Target date: 04/20/2022  Pt to be I in HEP in order to decrease his pain to no greater than a 3/10 Baseline:  Goal status: IN PROGRESS  2.  PT LE strength to be increased 1 grade to be able to rise from a crouched position to pick items off the floor.  Baseline:  Goal status: MET  3.  PT to be able to stand for 30 minutes without increased sx  Baseline:  Goal status: IN PROGRESS  4.  PT to demonstrate good  body mechanics for lifting  Baseline:  Goal status: IN PROGRESS  5.  PT to no longer have radicular sx.  Baseline:  Goal status: IN PROGRESS  PLAN:  PT FREQUENCY: 2x/week  PT DURATION: 6 weeks  PLANNED INTERVENTIONS: Therapeutic exercises, Therapeutic activity, Balance training, Patient/Family education, Self Care, and Manual therapy.  PLAN FOR NEXT SESSION:  Progress core stabilization as well as LE strengthening.  Rayetta Humphrey, [place] CLT [phone]

## 2022-08-08 ENCOUNTER — Other Ambulatory Visit: Payer: Self-pay | Admitting: Cardiovascular Disease

## 2022-08-08 ENCOUNTER — Other Ambulatory Visit: Payer: Self-pay | Admitting: Family

## 2022-09-26 ENCOUNTER — Other Ambulatory Visit: Payer: Self-pay | Admitting: Cardiovascular Disease

## 2023-02-01 ENCOUNTER — Other Ambulatory Visit: Payer: Self-pay | Admitting: Cardiovascular Disease

## 2023-06-13 ENCOUNTER — Encounter: Payer: Self-pay | Admitting: Internal Medicine

## 2023-06-15 ENCOUNTER — Other Ambulatory Visit: Payer: Self-pay | Admitting: Cardiovascular Disease

## 2023-06-16 ENCOUNTER — Telehealth: Payer: Self-pay | Admitting: Cardiovascular Disease

## 2023-06-16 NOTE — Telephone Encounter (Signed)
Left message for pt to get scheduled

## 2023-06-16 NOTE — Telephone Encounter (Signed)
 Left voicemail, pt needs appt scheduled from recall

## 2023-06-16 NOTE — Telephone Encounter (Signed)
-----   Message from [facility] Jasmine December C sent at 06/16/2023 10:11 AM EDT ----- Please contact patient for a follow up appointment. The patient is requesting refills on medications.  Thanks, Jasmine December

## 2023-06-20 ENCOUNTER — Encounter: Payer: Self-pay | Admitting: Internal Medicine

## 2023-06-21 ENCOUNTER — Encounter: Payer: Self-pay | Admitting: Internal Medicine

## 2023-06-21 ENCOUNTER — Ambulatory Visit: Admitting: Anesthesiology

## 2023-06-21 ENCOUNTER — Other Ambulatory Visit: Payer: Self-pay

## 2023-06-21 ENCOUNTER — Encounter
Admission: RE | Disposition: A | Discharge status: Home / Self Care | Payer: Self-pay | Source: Home / Self Care | Attending: Internal Medicine

## 2023-06-21 ENCOUNTER — Ambulatory Visit
Admission: RE | Admit: 2023-06-21 | Discharge: 2023-06-21 | Disposition: A | Discharge status: Home / Self Care | Payer: BC Managed Care – PPO | Attending: Internal Medicine | Admitting: Internal Medicine

## 2023-06-21 DIAGNOSIS — I10 Essential (primary) hypertension: Secondary | ICD-10-CM | POA: Diagnosis not present

## 2023-06-21 DIAGNOSIS — Z1211 Encounter for screening for malignant neoplasm of colon: Secondary | ICD-10-CM | POA: Insufficient documentation

## 2023-06-21 DIAGNOSIS — Z87891 Personal history of nicotine dependence: Secondary | ICD-10-CM | POA: Insufficient documentation

## 2023-06-21 DIAGNOSIS — K640 First degree hemorrhoids: Secondary | ICD-10-CM | POA: Diagnosis not present

## 2023-06-21 DIAGNOSIS — Z860101 Personal history of adenomatous and serrated colon polyps: Secondary | ICD-10-CM | POA: Diagnosis not present

## 2023-06-21 DIAGNOSIS — G4733 Obstructive sleep apnea (adult) (pediatric): Secondary | ICD-10-CM | POA: Insufficient documentation

## 2023-06-21 HISTORY — PX: COLONOSCOPY WITH PROPOFOL: SHX5780

## 2023-06-21 HISTORY — DX: Palpitations: R00.2

## 2023-06-21 SURGERY — COLONOSCOPY WITH PROPOFOL
Anesthesia: General

## 2023-06-21 MED ORDER — DEXMEDETOMIDINE HCL IN NACL 80 MCG/20ML IV SOLN
INTRAVENOUS | Status: DC | PRN
  Administered 2023-06-21: 20 ug via INTRAVENOUS

## 2023-06-21 MED ORDER — PROPOFOL 500 MG/50ML IV EMUL
INTRAVENOUS | Status: DC | PRN
  Administered 2023-06-21: 75 ug/kg/min via INTRAVENOUS

## 2023-06-21 MED ORDER — SODIUM CHLORIDE 0.9 % IV SOLN
INTRAVENOUS | Status: DC
Start: 2023-06-21 — End: 2023-06-21

## 2023-06-21 MED ORDER — LIDOCAINE HCL (CARDIAC) PF 100 MG/5ML IV SOSY
PREFILLED_SYRINGE | INTRAVENOUS | Status: DC | PRN
  Administered 2023-06-21: 80 mg via INTRAVENOUS

## 2023-06-21 MED ORDER — PROPOFOL 10 MG/ML IV BOLUS
INTRAVENOUS | Status: DC | PRN
  Administered 2023-06-21 (×2): 40 mg via INTRAVENOUS

## 2023-06-21 NOTE — Transfer of Care (Signed)
 Immediate Anesthesia Transfer of Care Note  Patient: Joshua Mann  Procedure(s) Performed: COLONOSCOPY WITH PROPOFOL  Patient Location: PACU  Anesthesia Type:General  Level of Consciousness: sedated  Airway & Oxygen Therapy: Patient Spontanous Breathing  Post-op Assessment: Report given to RN and Post -op Vital signs reviewed and stable  Post vital signs: Reviewed and stable  Last Vitals:  Vitals Value Taken Time  BP 89/50 06/21/23 1111  Temp    Pulse 52 06/21/23 1112  Resp 11 06/21/23 1112  SpO2 97 % 06/21/23 1112  Vitals shown include unfiled device data.  Last Pain:  Vitals:   06/21/23 1042  TempSrc: Tympanic  PainSc: 0-No pain         Complications: No notable events documented.

## 2023-06-21 NOTE — Interval H&P Note (Signed)
 History and Physical Interval Note:  06/21/2023 10:46 AM  Joshua Mann  has presented today for surgery, with the diagnosis of Z86.0101 (ICD-10-CM) - Personal history of adenomatous and serrated colon polyps.  The various methods of treatment have been discussed with the patient and family. After consideration of risks, benefits and other options for treatment, the patient has consented to  Procedure(s): COLONOSCOPY WITH PROPOFOL (N/A) as a surgical intervention.  The patient's history has been reviewed, patient examined, no change in status, stable for surgery.  I have reviewed the patient's chart and labs.  Questions were answered to the patient's satisfaction.     [place], [place]

## 2023-06-21 NOTE — Anesthesia Preprocedure Evaluation (Signed)
 Anesthesia Evaluation  Patient identified by MRN, date of birth, ID band Patient awake    Reviewed: Allergy & Precautions, NPO status , Patient's Chart, lab work & pertinent test results  History of Anesthesia Complications Negative for: history of anesthetic complications  Airway Mallampati: II  TM Distance: >3 FB Neck ROM: Full    Dental no notable dental hx. (+) Teeth Intact   Pulmonary sleep apnea , neg COPD, Patient abstained from smoking.Not current smoker, former smoker   Pulmonary exam normal breath sounds clear to auscultation       Cardiovascular Exercise Tolerance: Good METShypertension, Pt. on medications (-) CAD and (-) Past MI (-) dysrhythmias  Rhythm:Regular Rate:Normal - Systolic murmurs    Neuro/Psych negative neurological ROS  negative psych ROS   GI/Hepatic ,neg GERD  ,,(+)     (-) substance abuse    Endo/Other  neg diabetes    Renal/GU negative Renal ROS     Musculoskeletal   Abdominal   Peds  Hematology   Anesthesia Other Findings Past Medical History: 10/14/2009: Bruit     Comment:  Asymptomatic Bruit, Normal Carotid Doppler 10/14/2009: Chest pain     Comment:  Stress Test demontrated a small fixed Inferolateral               perfusion defect 10/14/2009: Claudication (HCC)     Comment:  Normal Lower Arterial Doppler No date: Hypertension     Comment:  controlled No date: Mixed dyslipidemia 10/31/2008: Murmur     Comment:  Echo EF >55%  AV apear mildly Sclerotic, No AS No date: Obstructive sleep apnea     Comment:  on c-pap No date: Palpitations  Reproductive/Obstetrics                             Anesthesia Physical Anesthesia Plan  ASA: 2  Anesthesia Plan: General   Post-op Pain Management: Minimal or no pain anticipated   Induction: Intravenous  PONV Risk Score and Plan: 2 and Propofol infusion, TIVA and Ondansetron  Airway Management Planned:  Nasal Cannula  Additional Equipment: None  Intra-op Plan:   Post-operative Plan:   Informed Consent: I have reviewed the patients History and Physical, chart, labs and discussed the procedure including the risks, benefits and alternatives for the proposed anesthesia with the patient or authorized representative who has indicated his/her understanding and acceptance.     Dental advisory given  Plan Discussed with: CRNA and Surgeon  Anesthesia Plan Comments: (Discussed risks of anesthesia with patient, including possibility of difficulty with spontaneous ventilation under anesthesia necessitating airway intervention, PONV, and rare risks such as cardiac or respiratory or neurological events, and allergic reactions. Discussed the role of CRNA in patient's perioperative care. Patient understands.)       Anesthesia Quick Evaluation

## 2023-06-21 NOTE — Anesthesia Postprocedure Evaluation (Signed)
 Anesthesia Post Note  Patient: Joshua Mann  Procedure(s) Performed: COLONOSCOPY WITH PROPOFOL  Patient location during evaluation: Endoscopy Anesthesia Type: General Level of consciousness: awake and alert Pain management: pain level controlled Vital Signs Assessment: post-procedure vital signs reviewed and stable Respiratory status: spontaneous breathing, nonlabored ventilation, respiratory function stable and patient connected to nasal cannula oxygen Cardiovascular status: blood pressure returned to baseline and stable Postop Assessment: no apparent nausea or vomiting Anesthetic complications: no   No notable events documented.   Last Vitals:  Vitals:   06/21/23 1122 06/21/23 1132  BP: 96/60 111/62  Pulse:    Resp:    Temp:    SpO2:      Last Pain:  Vitals:   06/21/23 1132  TempSrc:   PainSc: 0-No pain                 Corinda Gubler

## 2023-06-21 NOTE — H&P (Signed)
 Outpatient short stay form Pre-procedure 06/21/2023 9:58 AM Joshua Mann, M.D.  Primary Physician: Denies Durene Cal, M.D.   Reason for visit:  Personal history of adenomatous colon polyp  History of present illness:  02/12/18 - Tubular adenoma. Patient denies change in bowel habits, rectal bleeding, weight loss or abdominal pain.     No current facility-administered medications for this encounter.  Current Outpatient Medications:    amLODipine (NORVASC) 10 MG tablet, Take 1 tablet by mouth once daily, Disp: 90 tablet, Rfl: 0   aspirin 81 MG EC tablet, Take 81 mg by mouth daily., Disp: , Rfl:    doxazosin (CARDURA) 4 MG tablet, Take 1 tab (4MG ) every AM, then take 2 tabs (8MG ) every PM, Disp: 270 tablet, Rfl: 1   doxazosin (CARDURA) 8 MG tablet, Take 1 tablet by mouth once daily, Disp: 90 tablet, Rfl: 3   losartan (COZAAR) 100 MG tablet, TAKE 1 TABLET BY MOUTH ONCE DAILY. PLEASE SCHEDULE YOUR ANNUAL APPOINTMENT WITH CARDIOLOGIST FOR FUTURE REFILLS., Disp: 30 tablet, Rfl: 0   pregabalin (LYRICA) 75 MG capsule, Take 1 capsule (75 mg total) by mouth 2 (two) times daily., Disp: 30 capsule, Rfl: 0  No medications prior to admission.     Allergies  Allergen Reactions   Lisinopril Cough     Past Medical History:  Diagnosis Date   Bruit 10/14/2009   Asymptomatic Bruit, Normal Carotid Doppler   Chest pain 10/14/2009   Stress Test demontrated a small fixed Inferolateral perfusion defect   Claudication (HCC) 10/14/2009   Normal Lower Arterial Doppler   Hypertension    controlled   Mixed dyslipidemia    Murmur 10/31/2008   Echo EF >55%  AV apear mildly Sclerotic, No AS   Obstructive sleep apnea    on c-pap   Palpitations     Review of systems:  Otherwise negative.    Physical Exam  Gen: Alert, oriented. Appears stated age.  HEENT: [place]/AT. PERRLA. Lungs: CTA, no wheezes. CV: RR nl S1, S2. Abd: soft, benign, no masses. BS+ Ext: No edema. Pulses 2+    Planned  procedures: Proceed with colonoscopy. The patient understands the nature of the planned procedure, indications, risks, alternatives and potential complications including but not limited to bleeding, infection, perforation, damage to internal organs and possible oversedation/side effects from anesthesia. The patient agrees and gives consent to proceed.  Please refer to procedure notes for findings, recommendations and patient disposition/instructions.     Joshua Mann, M.D. Gastroenterology 06/21/2023  9:58 AM

## 2023-06-21 NOTE — Op Note (Signed)
 [facility] Gastroenterology Patient Name: Joshua Mann Procedure Date: 06/21/2023 10:33 AM MRN: [id number] Account #: [id number] Date of Birth: 1960-04-11 Admit Type: Outpatient Age: 63 Room: [facility] ENDO ROOM 2 Gender: Male Note Status: Finalized Instrument Name: Nelda Marseille [id number] Procedure:             Colonoscopy Indications:           High risk colon cancer surveillance: Personal history                         of non-advanced adenoma, High risk colon cancer                         surveillance: Personal history of sessile serrated                         colon polyp (less than 10 mm in size) with no dysplasia Providers:             Boykin Nearing. Norma Fredrickson MD, MD Referring MD:          Alvina Filbert (Referring MD) Medicines:             Propofol per Anesthesia Complications:         No immediate complications. Procedure:             Pre-Anesthesia Assessment:                        - The risks and benefits of the procedure and the                         sedation options and risks were discussed with the                         patient. All questions were answered and informed                         consent was obtained.                        - Patient identification and proposed procedure were                         verified prior to the procedure by the nurse. The                         procedure was verified in the procedure room.                        - ASA Grade Assessment: III - A patient with severe                         systemic disease.                        - After reviewing the risks and benefits, the patient                         was deemed in satisfactory condition to undergo the  procedure.                        After obtaining informed consent, the colonoscope was                         passed under direct vision. Throughout the procedure,                         the patient's blood pressure, pulse, and oxygen                          saturations were monitored continuously. The                         Colonoscope was introduced through the anus and                         advanced to the the cecum, identified by appendiceal                         orifice and ileocecal valve. The colonoscopy was                         performed without difficulty. The patient tolerated                         the procedure well. The quality of the bowel                         preparation was good. The ileocecal valve, appendiceal                         orifice, and rectum were photographed. Findings:      The perianal and digital rectal examinations were normal. Pertinent       negatives include normal sphincter tone and no palpable rectal lesions.      Non-bleeding internal hemorrhoids were found during retroflexion. The       hemorrhoids were Grade I (internal hemorrhoids that do not prolapse).      The exam was otherwise without abnormality. Impression:            - Non-bleeding internal hemorrhoids.                        - The examination was otherwise normal.                        - No specimens collected. Recommendation:        - Patient has a contact number available for                         emergencies. The signs and symptoms of potential                         delayed complications were discussed with the patient.                         Return to normal activities tomorrow. Written  discharge instructions were provided to the patient.                        - Resume previous diet.                        - Continue present medications.                        - Repeat colonoscopy in 10 years for screening                         purposes.                        - Return to GI office PRN.                        - The findings and recommendations were discussed with                         the patient. Procedure Code(s):     --- Professional ---                         X9147, Colorectal cancer screening; colonoscopy on                         individual at high risk Diagnosis Code(s):     --- Professional ---                        Z86.010, Personal history of colonic polyps                        K64.0, First degree hemorrhoids CPT copyright 2022 American Medical Association. All rights reserved. The codes documented in this report are preliminary and upon coder review may  be revised to meet current compliance requirements. Stanton Kidney MD, MD 06/21/2023 11:57:56 AM This report has been signed electronically. Number of Addenda: 0 Note Initiated On: 06/21/2023 10:33 AM Scope Withdrawal Time: 0 hours 6 minutes 15 seconds  Total Procedure Duration: 0 hours 9 minutes 42 seconds  Estimated Blood Loss:  Estimated blood loss: none.      [facility]

## 2023-06-22 ENCOUNTER — Encounter: Payer: Self-pay | Admitting: Internal Medicine

## 2023-08-07 NOTE — Progress Notes (Signed)
 Date:  08/08/2023   ID:  Joshua Mann, DOB 1960-06-07, MRN [id number]  Patient Location:  [address] [place] [address]   Provider location:   Lowella Ruder, [place] office  PCP:  Twylla Galen, MD  Cardiologist:  Cheryll Corti [facility]  Chief Complaint  Patient presents with   12 month follow up     "Doing well."     History of Present Illness:    Joshua Mann is a 63 y.o. male  past medical history of obstructive sleep apnea off CPAP,  PACs on Holter monitor Prior cholesterol more than 220 Chronic leg cramping unrelated to statin Normal coronary calcium score of 0. In 6/22 presents for followup of his hypertension,  high cholesterol, bradycardia.  LOV 11/23 In follow-up he reports having Chronic left knee pain Back pain, prior surgery Periodic leg cramping relieved with tonic water Previously stopped statins secondary to leg cramping  Calcium score 2022 score of 0 Cardiac CTA 2023 no coronary disease calcium score 0  Reports having lower extremity edema, trace on today's visit above the sock line Takes amlodipine  10 daily, Cardura  8 mg in the evening, losartan  100 mg  No shortness of breath or chest pain on exertion  EKG personally reviewed by myself on todays visit EKG Interpretation Date/Time:  Tuesday Aug 08 2023 16:34:54 EDT Ventricular Rate:  60 PR Interval:  128 QRS Duration:  98 QT Interval:  386 QTC Calculation: 386 R Axis:   -15  Text Interpretation: Normal sinus rhythm Moderate voltage criteria for LVH, may be normal variant ( R in aVL , Sokolow-Lyon ) Nonspecific T wave abnormality When compared with ECG of 13-Dec-2014 19:37, No significant change was found Confirmed by Belva Boyden [phone]) on 08/08/2023 4:49:39 PM    Past Medical History:  Diagnosis Date   Bruit 10/14/2009   Asymptomatic Bruit, Normal Carotid Doppler   Chest pain 10/14/2009   Stress Test demontrated a small fixed  Inferolateral perfusion defect   Claudication (HCC) 10/14/2009   Normal Lower Arterial Doppler   Hypertension    controlled   Mixed dyslipidemia    Murmur 10/31/2008   Echo EF >55%  AV apear mildly Sclerotic, No AS   Obstructive sleep apnea    on c-pap   Palpitations    Past Surgical History:  Procedure Laterality Date   BACK SURGERY     COLONOSCOPY N/A    COLONOSCOPY     COLONOSCOPY WITH PROPOFOL  N/A 06/21/2023   Procedure: COLONOSCOPY WITH PROPOFOL ;  Surgeon: Toledo, Alphonsus Jeans, MD;  Location: [facility] ENDOSCOPY;  Service: Gastroenterology;  Laterality: N/A;   HEEL SPUR EXCISION     LUMBAR LAMINECTOMY/DECOMPRESSION MICRODISCECTOMY Right 06/07/2017   Procedure: LUMBAR LAMINECTOMY/DECOMPRESSION MICRODISCECTOMY 1 LEVEL-L5-S1;  Surgeon: Jodeen Munch, MD;  Location: [facility] ORS;  Service: Neurosurgery;  Laterality: Right;   Ulcers  03/21/2000   Stomach Bleeding   UPPER GASTROINTESTINAL ENDOSCOPY N/A      Current Meds  Medication Sig   amLODipine  (NORVASC ) 10 MG tablet Take 1 tablet by mouth once daily   aspirin 81 MG EC tablet Take 81 mg by mouth daily.   doxazosin  (CARDURA ) 8 MG tablet Take 1 tablet by mouth once daily   losartan  (COZAAR ) 100 MG tablet TAKE 1 TABLET BY MOUTH ONCE DAILY. PLEASE SCHEDULE YOUR ANNUAL APPOINTMENT WITH CARDIOLOGIST FOR FUTURE REFILLS.     Allergies:   Lisinopril   Social History   Tobacco Use   Smoking status: Former  Current packs/day: 0.00    Average packs/day: 1 pack/day for 12.0 years (12.0 ttl pk-yrs)    Types: Cigarettes    Start date: 03/21/1970    Quit date: 03/21/1982    Years since quitting: 41.4   Smokeless tobacco: Never  Vaping Use   Vaping status: Never Used  Substance Use Topics   Alcohol use: Never   Drug use: No     Family Hx: The patient's family history includes Cancer (age of onset: 10) in his sister; Cancer (age of onset: 61) in his father; Hypertension in his mother.  ROS:   Please see the history of present  illness.    Review of Systems  Constitutional: Negative.   HENT: Negative.    Respiratory: Negative.    Cardiovascular: Negative.   Gastrointestinal: Negative.   Musculoskeletal: Negative.        Cramps in the legs  Neurological: Negative.   Psychiatric/Behavioral: Negative.    All other systems reviewed and are negative.   Labs/Other Tests and Data Reviewed:    Recent Labs: No results found for requested labs within last 365 days.   Recent Lipid Panel Lab Results  Component Value Date/Time   CHOL 170 11/13/2015 10:14 AM   TRIG 285 (H) 11/13/2015 10:14 AM   HDL 33 (L) 11/13/2015 10:14 AM   CHOLHDL 5.2 (H) 11/13/2015 10:14 AM   LDLCALC 80 11/13/2015 10:14 AM    Wt Readings from Last 3 Encounters:  08/08/23 217 lb 4 oz (98.5 kg)  06/21/23 218 lb 12.8 oz (99.2 kg)  02/09/22 231 lb 2 oz (104.8 kg)     Exam:    Vital Signs: Vital signs may also be detailed in the HPI BP 130/70 (BP Location: Left Arm, Patient Position: Sitting, Cuff Size: Normal)   Pulse 60   Ht 6\' 1"  (1.854 m)   Wt 217 lb 4 oz (98.5 kg)   SpO2 95%   BMI 28.66 kg/m   Constitutional:  oriented to person, place, and time. No distress.  HENT:  Head: Grossly normal Eyes:  no discharge. No scleral icterus.  Neck: No JVD, no carotid bruits  Cardiovascular: Regular rate and rhythm, no murmurs appreciated Pulmonary/Chest: Clear to auscultation bilaterally, no wheezes or rails Abdominal: Soft.  no distension.  no tenderness.  Musculoskeletal: Normal range of motion Neurological:  normal muscle tone. Coordination normal. No atrophy Skin: Skin warm and dry Psychiatric: normal affect, pleasant  ASSESSMENT & PLAN:    Essential hypertension- -patient lower extremity swelling possibly exacerbated by amlodipine  -Recommended he decrease amlodipine  down to 5 from 10, increase Cardura  up to 8 mg in the evening, 4 mg in the morning, stay on losartan  100  Chest pain  cardiac CTA with calcium score 0, no  significant coronary disease  Chronic leg cramps Statin previously held - Uses tonic water for continued leg cramps  Mixed hyperlipidemia Management limited by cramping Given calcium score 0 with no coronary disease, recommended lifestyle modification  Bradycardia Asymptomatic  Chest tightness calcium score 0, cardiac CTA with no coronary disease No further workup needed at this time  OSA off CPAP no longer uses his CPAP  Palpitations No significant tachypalpitations   Signed, Belva Boyden, MD  08/08/2023 4:49 PM    [facility] [address], [place], [place] [address]

## 2023-08-08 ENCOUNTER — Ambulatory Visit: Attending: Cardiovascular Disease | Admitting: Cardiovascular Disease

## 2023-08-08 VITALS — BP 130/70 | HR 60 | Ht 73.0 in | Wt 217.2 lb

## 2023-08-08 DIAGNOSIS — G4733 Obstructive sleep apnea (adult) (pediatric): Secondary | ICD-10-CM | POA: Diagnosis not present

## 2023-08-08 DIAGNOSIS — R072 Precordial pain: Secondary | ICD-10-CM

## 2023-08-08 DIAGNOSIS — R001 Bradycardia, unspecified: Secondary | ICD-10-CM

## 2023-08-08 DIAGNOSIS — E782 Mixed hyperlipidemia: Secondary | ICD-10-CM | POA: Diagnosis not present

## 2023-08-08 DIAGNOSIS — I10 Essential (primary) hypertension: Secondary | ICD-10-CM

## 2023-08-08 MED ORDER — AMLODIPINE BESYLATE 5 MG PO TABS: 5.0000 mg | ORAL_TABLET | Freq: Every day | ORAL | 3 refills | Status: AC

## 2023-08-08 MED ORDER — DOXAZOSIN MESYLATE 8 MG PO TABS: 8.0000 mg | ORAL_TABLET | Freq: Every day | ORAL | 3 refills | Status: DC

## 2023-08-08 MED ORDER — LOSARTAN POTASSIUM 100 MG PO TABS: 100.0000 mg | ORAL_TABLET | Freq: Every day | ORAL | 3 refills | Status: AC

## 2023-08-08 NOTE — Patient Instructions (Addendum)
 Medication Instructions:  Please decrease the amlodipine  down to 5 mg daily evening  Increase the cardura  up to 8 mg in the evening and 4 mg in the morning   Stay on losartan  100 mg daily  If you need a refill on your cardiac medications before your next appointment, please call your pharmacy.   Lab work: No new labs needed  Testing/Procedures: No new testing needed  Follow-Up: At [facility], you and your health needs are our priority.  As part of our continuing mission to provide you with exceptional heart care, we have created designated Provider Care Teams.  These Care Teams include your primary Cardiologist (physician) and Advanced Practice Providers (APPs -  Physician Assistants and Nurse Practitioners) who all work together to provide you with the care you need, when you need it.  You will need a follow up appointment in 12 months  Providers on your designated Care Team:   Laneta Pintos, NP Varney Gentleman, PA-C Cadence Gennaro Khat, [place]  COVID-19 Vaccine Information can be found at: [email or web address] For questions related to vaccine distribution or appointments, please email [email or web address] or call [phone].

## 2023-08-24 ENCOUNTER — Emergency Department (HOSPITAL_COMMUNITY)
Admission: EM | Admit: 2023-08-24 | Discharge: 2023-08-25 | Disposition: A | Discharge status: Home / Self Care | Attending: Emergency Medicine | Admitting: Emergency Medicine

## 2023-08-24 ENCOUNTER — Other Ambulatory Visit: Payer: Self-pay

## 2023-08-24 ENCOUNTER — Emergency Department (HOSPITAL_COMMUNITY)

## 2023-08-24 ENCOUNTER — Encounter (HOSPITAL_COMMUNITY): Payer: Self-pay

## 2023-08-24 DIAGNOSIS — R42 Dizziness and giddiness: Secondary | ICD-10-CM | POA: Diagnosis not present

## 2023-08-24 DIAGNOSIS — Z7982 Long term (current) use of aspirin: Secondary | ICD-10-CM | POA: Insufficient documentation

## 2023-08-24 DIAGNOSIS — R079 Chest pain, unspecified: Secondary | ICD-10-CM

## 2023-08-24 NOTE — ED Triage Notes (Signed)
 Pt reports:  Chest pain Started around 4pm Dizziness Started around 9pm  Feeling hot "Feels like I was going to pass out."

## 2023-08-25 LAB — HEPATIC FUNCTION PANEL
ALT: 25 U/L (ref 0–44)
AST: 26 U/L (ref 15–41)
Albumin: 4 g/dL (ref 3.5–5.0)
Alkaline Phosphatase: 54 U/L (ref 38–126)
Bilirubin, Direct: 0.1 mg/dL (ref 0.0–0.2)
Indirect Bilirubin: 1 mg/dL — ABNORMAL HIGH (ref 0.3–0.9)
Total Bilirubin: 1.1 mg/dL (ref 0.0–1.2)
Total Protein: 6.7 g/dL (ref 6.5–8.1)

## 2023-08-25 LAB — CBC
HCT: 41.7 % (ref 39.0–52.0)
Hemoglobin: 13.5 g/dL (ref 13.0–17.0)
MCH: 27 pg (ref 26.0–34.0)
MCHC: 32.4 g/dL (ref 30.0–36.0)
MCV: 83.4 fL (ref 80.0–100.0)
Platelets: 201 10*3/uL (ref 150–400)
RBC: 5 MIL/uL (ref 4.22–5.81)
RDW: 12.8 % (ref 11.5–15.5)
WBC: 7.5 10*3/uL (ref 4.0–10.5)
nRBC: 0 % (ref 0.0–0.2)

## 2023-08-25 LAB — BASIC METABOLIC PANEL WITH GFR
Anion gap: 9 (ref 5–15)
BUN: 12 mg/dL (ref 8–23)
CO2: 25 mmol/L (ref 22–32)
Calcium: 8.6 mg/dL — ABNORMAL LOW (ref 8.9–10.3)
Chloride: 105 mmol/L (ref 98–111)
Creatinine, Ser: 1.14 mg/dL (ref 0.61–1.24)
GFR, Estimated: >60 mL/min (ref >60)
Glucose, Bld: 101 mg/dL — ABNORMAL HIGH (ref 70–99)
Potassium: 3.8 mmol/L (ref 3.5–5.1)
Sodium: 139 mmol/L (ref 135–145)

## 2023-08-25 LAB — TROPONIN I (HIGH SENSITIVITY)
Troponin I (High Sensitivity): 19 ng/L — ABNORMAL HIGH (ref <18)
Troponin I (High Sensitivity): 21 ng/L — ABNORMAL HIGH (ref <18)

## 2023-08-25 LAB — D-DIMER, QUANTITATIVE: D-Dimer, Quant: <0.27 ug{FEU}/mL (ref 0.00–0.50)

## 2023-08-25 MED ORDER — OXYCODONE-ACETAMINOPHEN 5-325 MG PO TABS
1.0000 | ORAL_TABLET | Freq: Once | ORAL | Status: AC
  Administered 2023-08-25: 1 via ORAL
  Filled 2023-08-25: qty 1

## 2023-08-25 MED ORDER — LACTATED RINGERS IV BOLUS
1000.0000 mL | Freq: Once | INTRAVENOUS | Status: AC
  Administered 2023-08-25: 1000 mL via INTRAVENOUS

## 2023-08-25 NOTE — ED Provider Notes (Signed)
 [place] EMERGENCY DEPARTMENT AT [facility] Provider Note   CSN: [id number] Arrival date & time: 08/24/23  2230     History  Chief Complaint  Patient presents with   Chest Pain   Dizziness    Joshua Mann is a 63 y.o. male.  63 year old male that presents the ER today with chest pains and lightheadedness.  Patient states started earlier today with just the lightheadedness and dizziness.  Patient states he does not do anything in particular that time his right after getting haircut.  No neck pain or shoulder pain associated with it.  He states that this lasted for about 2 minutes and improved and seems to still be there a little bit but deafly not like it was earlier.  He had a episode of left upper chest pain just prior to arrival.  No shortness of breath, nausea, vomiting, diaphoresis or other associated symptoms.  Presents here for further evaluation.  Denies any injury, fever, cough or any other associated symptoms.  Not really had any at this before.   Chest Pain Associated symptoms: dizziness   Dizziness Associated symptoms: chest pain        Home Medications Prior to Admission medications   Medication Sig Start Date End Date Taking? Authorizing Provider  amLODipine  (NORVASC ) 5 MG tablet Take 1 tablet (5 mg total) by mouth daily. 08/08/23   Gollan, Timothy J, MD  aspirin 81 MG EC tablet Take 81 mg by mouth daily.    [provider]  doxazosin  (CARDURA ) 8 MG tablet Take 1 tablet (8 mg total) by mouth daily. Take 4 mg ( 0.5 tablet) in AM and 8 MG ( 1 tablet)  in the evening. 08/08/23   Gollan, Timothy J, MD  losartan  (COZAAR ) 100 MG tablet Take 1 tablet (100 mg total) by mouth daily. 08/08/23   Gollan, Timothy J, MD  pregabalin  (LYRICA ) 75 MG capsule Take 1 capsule (75 mg total) by mouth 2 (two) times daily. Patient not taking: Reported on 08/08/2023 07/06/21   Velma Ghazi, DPM      Allergies    Lisinopril    Review of Systems   Review of  Systems  Cardiovascular:  Positive for chest pain.  Neurological:  Positive for dizziness.    Physical Exam Updated Vital Signs BP (!) 148/79   Pulse (!) 47   Temp 98.5 F (36.9 C) (Oral)   Resp 12   Ht 6\' 1"  (1.854 m)   Wt 99.8 kg   SpO2 94%   BMI 29.03 kg/m  Physical Exam Vitals and nursing note reviewed.  Constitutional:      Appearance: He is well-developed.  HENT:     Head: Normocephalic and atraumatic.  Cardiovascular:     Rate and Rhythm: Normal rate.  Pulmonary:     Effort: Pulmonary effort is normal. No respiratory distress.  Abdominal:     General: There is no distension.  Musculoskeletal:        General: Normal range of motion.     Cervical back: Normal range of motion.  Neurological:     Mental Status: He is alert.     ED Results / Procedures / Treatments   Labs (all labs ordered are listed, but only abnormal results are displayed) Labs Reviewed  BASIC METABOLIC PANEL WITH GFR - Abnormal; Notable for the following components:      Result Value   Glucose, Bld 101 (*)    Calcium 8.6 (*)    All other  components within normal limits  HEPATIC FUNCTION PANEL - Abnormal; Notable for the following components:   Indirect Bilirubin 1.0 (*)    All other components within normal limits  TROPONIN I (HIGH SENSITIVITY) - Abnormal; Notable for the following components:   Troponin I (High Sensitivity) 19 (*)    All other components within normal limits  TROPONIN I (HIGH SENSITIVITY) - Abnormal; Notable for the following components:   Troponin I (High Sensitivity) 21 (*)    All other components within normal limits  CBC  D-DIMER, QUANTITATIVE    EKG EKG Interpretation Date/Time:  Thursday August 24 2023 23:01:54 EDT Ventricular Rate:  56 PR Interval:  140 QRS Duration:  84 QT Interval:  378 QTC Calculation: 364 R Axis:   -3  Text Interpretation: Sinus bradycardia Nonspecific T wave abnormality Abnormal ECG When compared with ECG of 08-Aug-2023 16:34, No  significant change was found Confirmed by Eve Hinders [phone]) on 08/25/2023 12:24:55 AM  Radiology DG Chest 2 View Result Date: 08/24/2023 CLINICAL DATA:  Chest pain. Mid chest pain radiating to the left side and up into the neck. Painful cough with swallowing. EXAM: CHEST - 2 VIEW COMPARISON:  CT chest 02/24/2022 FINDINGS: Normal heart size and pulmonary vascularity. No focal airspace disease or consolidation in the lungs. No blunting of costophrenic angles. No pneumothorax. Mediastinal contours appear intact. Degenerative changes in the spine. IMPRESSION: No active cardiopulmonary disease. Electronically Signed   By: Boyce Byes M.D.   On: 08/24/2023 23:16    Procedures Procedures    Medications Ordered in ED Medications  lactated ringers  bolus 1,000 mL (1,000 mLs Intravenous New Bag/Given 08/25/23 0037)  oxyCODONE -acetaminophen  (PERCOCET/ROXICET) 5-325 MG per tablet 1 tablet (1 tablet Oral Given 08/25/23 0037)    ED Course/ Medical Decision Making/ A&P                                 Medical Decision Making Amount and/or Complexity of Data Reviewed Labs: ordered. Radiology: ordered.  Risk Prescription drug management.   Consider possible PE versus ACS versus pneumonia versus pneumothorax however all these are reassuring.  He did have a haircut just prior to this but there was no neck manipulation did not feel any sharp pains in his neck or anything that that would suggest a vertebral artery/carotid artery dissection.  With his negative workup reassuring physical exam and was seen indication for hospitalization, further imaging or further workup in the emergency room at this time.  Patient overall appears stable will follow-up with PCP if not improving otherwise return here for any new or worsening symptoms.   Final Clinical Impression(s) / ED Diagnoses Final diagnoses:  Nonspecific chest pain  Dizziness    Rx / DC Orders ED Discharge Orders     None         Boniface Goffe,  Reymundo Caulk, MD 08/25/23 [phone]

## 2023-12-02 ENCOUNTER — Other Ambulatory Visit: Payer: Self-pay | Admitting: Cardiovascular Disease
